# Patient Record
Sex: Male | Born: 1975
Health system: Southern US, Community
[De-identification: ages and names within clinical notes are randomized; demographics above are authoritative.]

## PROBLEM LIST (undated history)

## (undated) DIAGNOSIS — N200 Calculus of kidney: Secondary | ICD-10-CM

## (undated) HISTORY — PX: KNEE SURGERY: SHX244

---

## 2002-01-31 ENCOUNTER — Encounter: Payer: Self-pay | Admitting: Emergency Medicine

## 2002-01-31 ENCOUNTER — Inpatient Hospital Stay (HOSPITAL_COMMUNITY): Admission: EM | Admit: 2002-01-31 | Discharge: 2002-02-03 | Payer: Self-pay | Admitting: Emergency Medicine

## 2004-12-10 ENCOUNTER — Emergency Department (HOSPITAL_COMMUNITY): Admission: EM | Admit: 2004-12-10 | Discharge: 2004-12-10 | Payer: Self-pay | Admitting: Emergency Medicine

## 2006-12-22 ENCOUNTER — Emergency Department (HOSPITAL_COMMUNITY): Admission: EM | Admit: 2006-12-22 | Discharge: 2006-12-22 | Payer: Self-pay | Admitting: Emergency Medicine

## 2007-06-07 ENCOUNTER — Emergency Department (HOSPITAL_COMMUNITY): Admission: EM | Admit: 2007-06-07 | Discharge: 2007-06-07 | Payer: Self-pay | Admitting: Emergency Medicine

## 2008-04-14 ENCOUNTER — Emergency Department (HOSPITAL_COMMUNITY): Admission: EM | Admit: 2008-04-14 | Discharge: 2008-04-14 | Payer: Self-pay | Admitting: Emergency Medicine
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
## 2011-02-08 ENCOUNTER — Emergency Department (HOSPITAL_COMMUNITY): Payer: Self-pay

## 2011-02-08 ENCOUNTER — Emergency Department (HOSPITAL_COMMUNITY)
Admission: EM | Admit: 2011-02-08 | Discharge: 2011-02-08 | Disposition: A | Discharge status: Home / Self Care | Payer: Self-pay | Attending: Emergency Medicine | Admitting: Emergency Medicine

## 2011-02-08 DIAGNOSIS — R339 Retention of urine, unspecified: Secondary | ICD-10-CM | POA: Insufficient documentation

## 2011-02-08 DIAGNOSIS — N201 Calculus of ureter: Secondary | ICD-10-CM | POA: Insufficient documentation

## 2011-02-08 DIAGNOSIS — R109 Unspecified abdominal pain: Secondary | ICD-10-CM | POA: Insufficient documentation

## 2011-02-08 LAB — URINALYSIS, ROUTINE W REFLEX MICROSCOPIC
Glucose, UA: NEGATIVE mg/dL
Ketones, ur: >80 mg/dL — AB
Nitrite: NEGATIVE
Protein, ur: 100 mg/dL — AB
Urobilinogen, UA: 1 mg/dL (ref 0.0–1.0)
pH: 6 (ref 5.0–8.0)

## 2011-02-08 LAB — DIFFERENTIAL
Basophils Absolute: 0 10*3/uL (ref 0.0–0.1)
Eosinophils Absolute: 0 10*3/uL (ref 0.0–0.7)
Eosinophils Relative: 0 % (ref 0–5)
Lymphs Abs: 2 10*3/uL (ref 0.7–4.0)
Monocytes Relative: 11 % (ref 3–12)
Neutro Abs: 15.5 10*3/uL — ABNORMAL HIGH (ref 1.7–7.7)
Neutrophils Relative %: 79 % — ABNORMAL HIGH (ref 43–77)

## 2011-02-08 LAB — BASIC METABOLIC PANEL
BUN: 12 mg/dL (ref 6–23)
CO2: 27 mEq/L (ref 19–32)
Calcium: 10.1 mg/dL (ref 8.4–10.5)
Chloride: 97 mEq/L (ref 96–112)
Creatinine, Ser: 1.46 mg/dL — ABNORMAL HIGH (ref 0.50–1.35)
GFR calc Af Amer: >60 mL/min (ref >60)
GFR calc non Af Amer: 55 mL/min — ABNORMAL LOW (ref >60)
Glucose, Bld: 83 mg/dL (ref 70–99)
Potassium: 4.1 mEq/L (ref 3.5–5.1)
Sodium: 137 mEq/L (ref 135–145)

## 2011-02-08 LAB — CBC
MCH: 31.5 pg (ref 26.0–34.0)
MCHC: 34.2 g/dL (ref 30.0–36.0)
MCV: 92 fL (ref 78.0–100.0)
Platelets: 229 10*3/uL (ref 150–400)
RBC: 4.86 MIL/uL (ref 4.22–5.81)
RDW: 13.8 % (ref 11.5–15.5)

## 2011-02-08 LAB — URINE MICROSCOPIC-ADD ON

## 2011-02-10 LAB — URINE CULTURE: Colony Count: NO GROWTH

## 2011-02-26 ENCOUNTER — Emergency Department (HOSPITAL_COMMUNITY)
Admission: EM | Admit: 2011-02-26 | Discharge: 2011-02-26 | Discharge status: Against Medical Advice | Payer: Self-pay | Attending: Emergency Medicine | Admitting: Emergency Medicine

## 2011-02-26 DIAGNOSIS — R109 Unspecified abdominal pain: Secondary | ICD-10-CM | POA: Insufficient documentation

## 2011-02-27 ENCOUNTER — Inpatient Hospital Stay (INDEPENDENT_AMBULATORY_CARE_PROVIDER_SITE_OTHER)
Admission: RE | Admit: 2011-02-27 | Discharge: 2011-02-27 | Disposition: A | Discharge status: Home / Self Care | Payer: Self-pay | Source: Ambulatory Visit | Attending: Family Medicine | Admitting: Family Medicine

## 2011-02-27 DIAGNOSIS — R319 Hematuria, unspecified: Secondary | ICD-10-CM

## 2011-02-27 LAB — POCT URINALYSIS DIP (DEVICE)
Bilirubin Urine: NEGATIVE
Glucose, UA: NEGATIVE mg/dL
Ketones, ur: NEGATIVE mg/dL
Nitrite: NEGATIVE
pH: 6.5 (ref 5.0–8.0)

## 2011-04-23 LAB — CULTURE, ROUTINE-ABSCESS

## 2011-05-02 LAB — CULTURE, ROUTINE-ABSCESS

## 2012-04-18 ENCOUNTER — Emergency Department (HOSPITAL_COMMUNITY)
Admission: EM | Admit: 2012-04-18 | Discharge: 2012-04-18 | Disposition: A | Discharge status: Home / Self Care | Payer: Self-pay | Attending: Emergency Medicine | Admitting: Emergency Medicine

## 2012-04-18 ENCOUNTER — Encounter (HOSPITAL_COMMUNITY): Payer: Self-pay | Admitting: Emergency Medicine

## 2012-04-18 DIAGNOSIS — Z833 Family history of diabetes mellitus: Secondary | ICD-10-CM | POA: Insufficient documentation

## 2012-04-18 DIAGNOSIS — Z809 Family history of malignant neoplasm, unspecified: Secondary | ICD-10-CM | POA: Insufficient documentation

## 2012-04-18 DIAGNOSIS — F17200 Nicotine dependence, unspecified, uncomplicated: Secondary | ICD-10-CM | POA: Insufficient documentation

## 2012-04-18 DIAGNOSIS — H1130 Conjunctival hemorrhage, unspecified eye: Secondary | ICD-10-CM | POA: Insufficient documentation

## 2012-04-18 DIAGNOSIS — Z8249 Family history of ischemic heart disease and other diseases of the circulatory system: Secondary | ICD-10-CM | POA: Insufficient documentation

## 2012-04-18 DIAGNOSIS — R220 Localized swelling, mass and lump, head: Secondary | ICD-10-CM | POA: Insufficient documentation

## 2012-04-18 HISTORY — DX: Calculus of kidney: N20.0
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
## 2012-04-18 NOTE — ED Provider Notes (Signed)
Medical screening examination/treatment/procedure(s) were performed by non-physician practitioner and as supervising physician I was immediately available for consultation/collaboration.   Lyanne Co, MD 04/18/12 1524

## 2016-10-21 ENCOUNTER — Ambulatory Visit (HOSPITAL_COMMUNITY)
Admission: EM | Admit: 2016-10-21 | Discharge: 2016-10-21 | Disposition: A | Discharge status: Home / Self Care | Payer: BLUE CROSS/BLUE SHIELD | Attending: Internal Medicine | Admitting: Internal Medicine

## 2016-10-21 ENCOUNTER — Encounter (HOSPITAL_COMMUNITY): Payer: Self-pay | Admitting: Emergency Medicine
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
## 2016-10-21 NOTE — ED Notes (Signed)
Patient gave a urine specimen, not seen by provider, did not wait to be seen, provider canceled order

## 2016-10-21 NOTE — ED Notes (Signed)
Unable to wait, left

## 2016-10-21 NOTE — ED Triage Notes (Signed)
The patient presented to the [facility] for an STD check. The patient denied any symptoms.

## 2020-06-15 ENCOUNTER — Encounter (HOSPITAL_COMMUNITY)
Admission: EM | Disposition: A | Discharge status: Home Health | Payer: Self-pay | Source: Home / Self Care | Attending: Student

## 2020-06-15 ENCOUNTER — Emergency Department (HOSPITAL_COMMUNITY): Payer: 59

## 2020-06-15 ENCOUNTER — Inpatient Hospital Stay (HOSPITAL_COMMUNITY): Payer: 59

## 2020-06-15 ENCOUNTER — Emergency Department (HOSPITAL_COMMUNITY): Payer: 59 | Admitting: Anesthesiology

## 2020-06-15 ENCOUNTER — Encounter (HOSPITAL_COMMUNITY): Payer: Self-pay

## 2020-06-15 ENCOUNTER — Inpatient Hospital Stay (HOSPITAL_COMMUNITY)
Admission: EM | Admit: 2020-06-15 | Discharge: 2020-06-19 | DRG: 480 | Disposition: A | Discharge status: Home Health | Payer: 59 | Attending: Student | Admitting: Student

## 2020-06-15 DIAGNOSIS — S82202B Unspecified fracture of shaft of left tibia, initial encounter for open fracture type I or II: Principal | ICD-10-CM

## 2020-06-15 DIAGNOSIS — Z419 Encounter for procedure for purposes other than remedying health state, unspecified: Secondary | ICD-10-CM

## 2020-06-15 DIAGNOSIS — T1490XA Injury, unspecified, initial encounter: Secondary | ICD-10-CM

## 2020-06-15 DIAGNOSIS — S82252B Displaced comminuted fracture of shaft of left tibia, initial encounter for open fracture type I or II: Secondary | ICD-10-CM | POA: Diagnosis present

## 2020-06-15 DIAGNOSIS — Z23 Encounter for immunization: Secondary | ICD-10-CM

## 2020-06-15 DIAGNOSIS — S82402B Unspecified fracture of shaft of left fibula, initial encounter for open fracture type I or II: Secondary | ICD-10-CM

## 2020-06-15 DIAGNOSIS — S8982XA Other specified injuries of left lower leg, initial encounter: Secondary | ICD-10-CM | POA: Diagnosis not present

## 2020-06-15 DIAGNOSIS — S82832B Other fracture of upper and lower end of left fibula, initial encounter for open fracture type I or II: Secondary | ICD-10-CM | POA: Diagnosis not present

## 2020-06-15 DIAGNOSIS — S0993XA Unspecified injury of face, initial encounter: Secondary | ICD-10-CM | POA: Diagnosis not present

## 2020-06-15 DIAGNOSIS — F17210 Nicotine dependence, cigarettes, uncomplicated: Secondary | ICD-10-CM | POA: Diagnosis not present

## 2020-06-15 DIAGNOSIS — S7222XA Displaced subtrochanteric fracture of left femur, initial encounter for closed fracture: Secondary | ICD-10-CM

## 2020-06-15 DIAGNOSIS — D62 Acute posthemorrhagic anemia: Secondary | ICD-10-CM | POA: Diagnosis not present

## 2020-06-15 DIAGNOSIS — S8292XB Unspecified fracture of left lower leg, initial encounter for open fracture type I or II: Secondary | ICD-10-CM

## 2020-06-15 DIAGNOSIS — Y92410 Unspecified street and highway as the place of occurrence of the external cause: Secondary | ICD-10-CM | POA: Diagnosis not present

## 2020-06-15 DIAGNOSIS — I959 Hypotension, unspecified: Secondary | ICD-10-CM | POA: Diagnosis present

## 2020-06-15 DIAGNOSIS — S72142A Displaced intertrochanteric fracture of left femur, initial encounter for closed fracture: Secondary | ICD-10-CM | POA: Diagnosis not present

## 2020-06-15 DIAGNOSIS — S81811A Laceration without foreign body, right lower leg, initial encounter: Secondary | ICD-10-CM | POA: Diagnosis present

## 2020-06-15 DIAGNOSIS — Z20822 Contact with and (suspected) exposure to covid-19: Secondary | ICD-10-CM | POA: Diagnosis not present

## 2020-06-15 DIAGNOSIS — T148XXA Other injury of unspecified body region, initial encounter: Secondary | ICD-10-CM

## 2020-06-15 DIAGNOSIS — S72002A Fracture of unspecified part of neck of left femur, initial encounter for closed fracture: Secondary | ICD-10-CM

## 2020-06-15 DIAGNOSIS — S82392B Other fracture of lower end of left tibia, initial encounter for open fracture type I or II: Secondary | ICD-10-CM | POA: Diagnosis not present

## 2020-06-15 HISTORY — PX: I & D EXTREMITY: SHX5045

## 2020-06-15 HISTORY — PX: EXTERNAL FIXATION LEG: SHX1549

## 2020-06-15 LAB — URINALYSIS, ROUTINE W REFLEX MICROSCOPIC
Bilirubin Urine: NEGATIVE
Glucose, UA: NEGATIVE mg/dL
Hgb urine dipstick: NEGATIVE
Ketones, ur: NEGATIVE mg/dL
Leukocytes,Ua: NEGATIVE
Nitrite: NEGATIVE
Protein, ur: NEGATIVE mg/dL
Specific Gravity, Urine: 1.011 (ref 1.005–1.030)
pH: 6 (ref 5.0–8.0)

## 2020-06-15 LAB — COMPREHENSIVE METABOLIC PANEL
ALT: 38 U/L (ref 0–44)
AST: 41 U/L (ref 15–41)
Albumin: 3 g/dL — ABNORMAL LOW (ref 3.5–5.0)
Alkaline Phosphatase: 35 U/L — ABNORMAL LOW (ref 38–126)
Anion gap: 12 (ref 5–15)
BUN: 7 mg/dL (ref 6–20)
CO2: 18 mmol/L — ABNORMAL LOW (ref 22–32)
Calcium: 8.1 mg/dL — ABNORMAL LOW (ref 8.9–10.3)
Chloride: 107 mmol/L (ref 98–111)
Creatinine, Ser: 1.15 mg/dL (ref 0.61–1.24)
GFR, Estimated: >60 mL/min (ref >60)
Glucose, Bld: 118 mg/dL — ABNORMAL HIGH (ref 70–99)
Potassium: 3.6 mmol/L (ref 3.5–5.1)
Sodium: 137 mmol/L (ref 135–145)
Total Bilirubin: 0.5 mg/dL (ref 0.3–1.2)
Total Protein: 5.4 g/dL — ABNORMAL LOW (ref 6.5–8.1)

## 2020-06-15 LAB — I-STAT CHEM 8, ED
BUN: 9 mg/dL (ref 6–20)
Calcium, Ion: 1.05 mmol/L — ABNORMAL LOW (ref 1.15–1.40)
Chloride: 106 mmol/L (ref 98–111)
Creatinine, Ser: 1.1 mg/dL (ref 0.61–1.24)
Glucose, Bld: 119 mg/dL — ABNORMAL HIGH (ref 70–99)
HCT: 38 % — ABNORMAL LOW (ref 39.0–52.0)
Hemoglobin: 12.9 g/dL — ABNORMAL LOW (ref 13.0–17.0)
Potassium: 3.6 mmol/L (ref 3.5–5.1)
Sodium: 139 mmol/L (ref 135–145)
TCO2: 20 mmol/L — ABNORMAL LOW (ref 22–32)

## 2020-06-15 LAB — RESP PANEL BY RT-PCR (FLU A&B, COVID) ARPGX2
Influenza A by PCR: NEGATIVE
Influenza B by PCR: NEGATIVE
SARS Coronavirus 2 by RT PCR: NEGATIVE

## 2020-06-15 LAB — CBC
HCT: 40.5 % (ref 39.0–52.0)
Hemoglobin: 13.4 g/dL (ref 13.0–17.0)
MCH: 31.9 pg (ref 26.0–34.0)
MCHC: 33.1 g/dL (ref 30.0–36.0)
MCV: 96.4 fL (ref 80.0–100.0)
Platelets: 210 10*3/uL (ref 150–400)
RBC: 4.2 MIL/uL — ABNORMAL LOW (ref 4.22–5.81)
RDW: 13.8 % (ref 11.5–15.5)
WBC: 12 10*3/uL — ABNORMAL HIGH (ref 4.0–10.5)
nRBC: 0 % (ref 0.0–0.2)

## 2020-06-15 LAB — PROTIME-INR
INR: 1.1 (ref 0.8–1.2)
Prothrombin Time: 13.7 seconds (ref 11.4–15.2)

## 2020-06-15 LAB — ETHANOL: Alcohol, Ethyl (B): 89 mg/dL — ABNORMAL HIGH (ref <10)

## 2020-06-15 LAB — ABO/RH: ABO/RH(D): A POS

## 2020-06-15 LAB — LACTIC ACID, PLASMA: Lactic Acid, Venous: 2.6 mmol/L (ref 0.5–1.9)

## 2020-06-15 SURGERY — EXTERNAL FIXATION, LOWER EXTREMITY
Anesthesia: General | Site: Leg Lower | Laterality: Right

## 2020-06-15 MED ORDER — SODIUM CHLORIDE 0.9 % IV SOLN: INTRAVENOUS | Status: DC

## 2020-06-15 MED ORDER — FENTANYL CITRATE (PF) 250 MCG/5ML IJ SOLN
INTRAMUSCULAR | Status: AC
  Filled 2020-06-15: qty 5

## 2020-06-15 MED ORDER — ACETAMINOPHEN 500 MG PO TABS
1000.0000 mg | ORAL_TABLET | Freq: Four times a day (QID) | ORAL | Status: AC
  Administered 2020-06-15 – 2020-06-16 (×2): 1000 mg via ORAL
  Filled 2020-06-15 (×2): qty 2

## 2020-06-15 MED ORDER — 0.9 % SODIUM CHLORIDE (POUR BTL) OPTIME
TOPICAL | Status: DC | PRN
  Administered 2020-06-15: 1000 mL

## 2020-06-15 MED ORDER — CEFAZOLIN SODIUM-DEXTROSE 2-4 GM/100ML-% IV SOLN
2.0000 g | Freq: Four times a day (QID) | INTRAVENOUS | Status: DC
  Administered 2020-06-15 – 2020-06-16 (×2): 2 g via INTRAVENOUS
  Filled 2020-06-15 (×2): qty 100

## 2020-06-15 MED ORDER — IOHEXOL 300 MG/ML  SOLN
100.0000 mL | Freq: Once | INTRAMUSCULAR | Status: AC | PRN
  Administered 2020-06-15: 100 mL via INTRAVENOUS

## 2020-06-15 MED ORDER — SUCCINYLCHOLINE 20MG/ML (10ML) SYRINGE FOR MEDFUSION PUMP - OPTIME
INTRAMUSCULAR | Status: DC | PRN
  Administered 2020-06-15: 120 mg via INTRAVENOUS

## 2020-06-15 MED ORDER — ACETAMINOPHEN 325 MG PO TABS: 325.0000 mg | ORAL_TABLET | Freq: Four times a day (QID) | ORAL | Status: DC | PRN

## 2020-06-15 MED ORDER — LACTATED RINGERS IV SOLN: INTRAVENOUS | Status: DC | PRN

## 2020-06-15 MED ORDER — ONDANSETRON HCL 4 MG/2ML IJ SOLN
INTRAMUSCULAR | Status: DC | PRN
  Administered 2020-06-15: 4 mg via INTRAVENOUS

## 2020-06-15 MED ORDER — CEFAZOLIN SODIUM-DEXTROSE 2-4 GM/100ML-% IV SOLN
2.0000 g | Freq: Once | INTRAVENOUS | Status: AC
  Administered 2020-06-15: 2 g via INTRAVENOUS

## 2020-06-15 MED ORDER — TETANUS-DIPHTH-ACELL PERTUSSIS 5-2.5-18.5 LF-MCG/0.5 IM SUSY
0.5000 mL | PREFILLED_SYRINGE | Freq: Once | INTRAMUSCULAR | Status: AC
  Administered 2020-06-15: 0.5 mL via INTRAMUSCULAR

## 2020-06-15 MED ORDER — SODIUM CHLORIDE 0.9 % IR SOLN
Status: DC | PRN
  Administered 2020-06-15 (×2): 3000 mL

## 2020-06-15 MED ORDER — POLYETHYLENE GLYCOL 3350 17 G PO PACK: 17.0000 g | PACK | Freq: Every day | ORAL | Status: DC | PRN

## 2020-06-15 MED ORDER — SODIUM CHLORIDE 0.9 % IV SOLN
INTRAVENOUS | Status: AC | PRN
  Administered 2020-06-15 (×2): 1000 mL via INTRAVENOUS

## 2020-06-15 MED ORDER — MIDAZOLAM HCL 2 MG/2ML IJ SOLN
INTRAMUSCULAR | Status: DC | PRN
  Administered 2020-06-15: 2 mg via INTRAVENOUS

## 2020-06-15 MED ORDER — METOCLOPRAMIDE HCL 5 MG PO TABS: 5.0000 mg | ORAL_TABLET | Freq: Three times a day (TID) | ORAL | Status: DC | PRN

## 2020-06-15 MED ORDER — HYDROMORPHONE HCL 1 MG/ML IJ SOLN: 0.5000 mg | INTRAMUSCULAR | Status: DC | PRN

## 2020-06-15 MED ORDER — OXYCODONE HCL 5 MG PO TABS
5.0000 mg | ORAL_TABLET | ORAL | Status: DC | PRN
  Administered 2020-06-16: 5 mg via ORAL
  Administered 2020-06-17 – 2020-06-18 (×4): 10 mg via ORAL
  Filled 2020-06-15 (×6): qty 2
  Filled 2020-06-15: qty 1
  Filled 2020-06-15 (×5): qty 2

## 2020-06-15 MED ORDER — ONDANSETRON HCL 4 MG/2ML IJ SOLN
INTRAMUSCULAR | Status: AC
  Filled 2020-06-15: qty 2

## 2020-06-15 MED ORDER — MIDAZOLAM HCL 2 MG/2ML IJ SOLN
INTRAMUSCULAR | Status: AC
  Filled 2020-06-15: qty 2

## 2020-06-15 MED ORDER — LIDOCAINE HCL (CARDIAC) PF 100 MG/5ML IV SOSY
PREFILLED_SYRINGE | INTRAVENOUS | Status: DC | PRN
  Administered 2020-06-15: 60 mg via INTRAVENOUS

## 2020-06-15 MED ORDER — PHENYLEPHRINE HCL (PRESSORS) 10 MG/ML IV SOLN
INTRAVENOUS | Status: AC
  Filled 2020-06-15: qty 1

## 2020-06-15 MED ORDER — OXYCODONE HCL 5 MG/5ML PO SOLN: 5.0000 mg | Freq: Once | ORAL | Status: DC | PRN

## 2020-06-15 MED ORDER — HYDROMORPHONE HCL 1 MG/ML IJ SOLN
1.0000 mg | INTRAMUSCULAR | Status: DC | PRN
  Administered 2020-06-15: 1 mg via INTRAVENOUS
  Filled 2020-06-15: qty 1

## 2020-06-15 MED ORDER — LIDOCAINE HCL (PF) 2 % IJ SOLN
INTRAMUSCULAR | Status: AC
  Filled 2020-06-15: qty 5

## 2020-06-15 MED ORDER — ONDANSETRON HCL 4 MG/2ML IJ SOLN: 4.0000 mg | Freq: Four times a day (QID) | INTRAMUSCULAR | Status: DC | PRN

## 2020-06-15 MED ORDER — DIPHENHYDRAMINE HCL 12.5 MG/5ML PO ELIX: 25.0000 mg | ORAL_SOLUTION | ORAL | Status: DC | PRN

## 2020-06-15 MED ORDER — ONDANSETRON HCL 4 MG PO TABS: 4.0000 mg | ORAL_TABLET | Freq: Four times a day (QID) | ORAL | Status: DC | PRN

## 2020-06-15 MED ORDER — FENTANYL CITRATE (PF) 100 MCG/2ML IJ SOLN
25.0000 ug | INTRAMUSCULAR | Status: DC | PRN
  Administered 2020-06-15 (×2): 50 ug via INTRAVENOUS

## 2020-06-15 MED ORDER — CEFAZOLIN SODIUM-DEXTROSE 1-4 GM/50ML-% IV SOLN
INTRAVENOUS | Status: DC | PRN
  Administered 2020-06-15: 1 g via INTRAVENOUS

## 2020-06-15 MED ORDER — CEFAZOLIN SODIUM 1 G IJ SOLR
INTRAMUSCULAR | Status: AC
  Filled 2020-06-15: qty 10

## 2020-06-15 MED ORDER — OXYCODONE HCL 5 MG PO TABS
10.0000 mg | ORAL_TABLET | ORAL | Status: DC | PRN
  Administered 2020-06-16 – 2020-06-19 (×8): 10 mg via ORAL
  Filled 2020-06-15: qty 2

## 2020-06-15 MED ORDER — PROPOFOL 10 MG/ML IV BOLUS
INTRAVENOUS | Status: AC
  Filled 2020-06-15: qty 20

## 2020-06-15 MED ORDER — METHOCARBAMOL 500 MG PO TABS
500.0000 mg | ORAL_TABLET | Freq: Four times a day (QID) | ORAL | Status: DC | PRN
  Administered 2020-06-17 – 2020-06-19 (×7): 500 mg via ORAL
  Filled 2020-06-15 (×9): qty 1

## 2020-06-15 MED ORDER — DEXAMETHASONE SODIUM PHOSPHATE 10 MG/ML IJ SOLN
INTRAMUSCULAR | Status: AC
  Filled 2020-06-15: qty 1

## 2020-06-15 MED ORDER — FENTANYL CITRATE (PF) 250 MCG/5ML IJ SOLN
INTRAMUSCULAR | Status: DC | PRN
  Administered 2020-06-15: 100 ug via INTRAVENOUS

## 2020-06-15 MED ORDER — MAGNESIUM CITRATE PO SOLN: 1.0000 | Freq: Once | ORAL | Status: DC | PRN

## 2020-06-15 MED ORDER — SORBITOL 70 % SOLN
30.0000 mL | Freq: Every day | Status: DC | PRN
  Filled 2020-06-15: qty 30

## 2020-06-15 MED ORDER — SUCCINYLCHOLINE CHLORIDE 200 MG/10ML IV SOSY
PREFILLED_SYRINGE | INTRAVENOUS | Status: AC
  Filled 2020-06-15: qty 10

## 2020-06-15 MED ORDER — ROCURONIUM BROMIDE 10 MG/ML (PF) SYRINGE
PREFILLED_SYRINGE | INTRAVENOUS | Status: AC
  Filled 2020-06-15: qty 10

## 2020-06-15 MED ORDER — FENTANYL CITRATE (PF) 100 MCG/2ML IJ SOLN
INTRAMUSCULAR | Status: AC
  Filled 2020-06-15: qty 2

## 2020-06-15 MED ORDER — OXYCODONE HCL 5 MG PO TABS: 5.0000 mg | ORAL_TABLET | Freq: Once | ORAL | Status: DC | PRN

## 2020-06-15 MED ORDER — PROPOFOL 10 MG/ML IV BOLUS
INTRAVENOUS | Status: DC | PRN
  Administered 2020-06-15: 170 mg via INTRAVENOUS

## 2020-06-15 MED ORDER — DOCUSATE SODIUM 100 MG PO CAPS
100.0000 mg | ORAL_CAPSULE | Freq: Two times a day (BID) | ORAL | Status: DC
  Administered 2020-06-16 – 2020-06-19 (×6): 100 mg via ORAL
  Filled 2020-06-15 (×6): qty 1

## 2020-06-15 MED ORDER — PHENYLEPHRINE HCL (PRESSORS) 10 MG/ML IV SOLN
INTRAVENOUS | Status: DC | PRN
  Administered 2020-06-15: 120 ug via INTRAVENOUS
  Administered 2020-06-15 (×2): 80 ug via INTRAVENOUS
  Administered 2020-06-15: 120 ug via INTRAVENOUS

## 2020-06-15 MED ORDER — PHENYLEPHRINE HCL-NACL 10-0.9 MG/250ML-% IV SOLN
INTRAVENOUS | Status: DC | PRN
  Administered 2020-06-15: 50 ug/min via INTRAVENOUS

## 2020-06-15 MED ORDER — METHOCARBAMOL 1000 MG/10ML IJ SOLN
500.0000 mg | Freq: Four times a day (QID) | INTRAVENOUS | Status: DC | PRN
  Filled 2020-06-15 (×3): qty 5

## 2020-06-15 MED ORDER — LACTATED RINGERS IV SOLN: INTRAVENOUS | Status: DC

## 2020-06-15 MED ORDER — METOCLOPRAMIDE HCL 5 MG/ML IJ SOLN: 5.0000 mg | Freq: Three times a day (TID) | INTRAMUSCULAR | Status: DC | PRN

## 2020-06-15 MED ORDER — DEXAMETHASONE SODIUM PHOSPHATE 10 MG/ML IJ SOLN
INTRAMUSCULAR | Status: DC | PRN
  Administered 2020-06-15: 10 mg via INTRAVENOUS

## 2020-06-15 SURGICAL SUPPLY — 51 items
BAR EXFX 150X11 NS LF (EXFIX) ×4
BAR EXFX 400X11 NS LF (EXFIX) ×4
BAR GLASS FIBER EXFX 11X150 (EXFIX) ×4 IMPLANT
BAR GLASS FIBER EXFX 11X400 (EXFIX) ×4 IMPLANT
BIT DRILL CANN MED FLUTE 4.0 (BIT) IMPLANT
BNDG ELASTIC 4X5.8 VLCR STR LF (GAUZE/BANDAGES/DRESSINGS) ×14 IMPLANT
BNDG GAUZE ELAST 4 BULKY (GAUZE/BANDAGES/DRESSINGS) ×12 IMPLANT
CLAMP BLUE BAR TO BAR (EXFIX) ×4 IMPLANT
CLAMP BLUE BAR TO PIN (EXFIX) ×12 IMPLANT
COVER SURGICAL LIGHT HANDLE (MISCELLANEOUS) ×4 IMPLANT
COVER WAND RF STERILE (DRAPES) ×4 IMPLANT
DRAPE BILATERAL LIMB T (DRAPES) ×2 IMPLANT
DRAPE C-ARM 42X72 X-RAY (DRAPES) ×2 IMPLANT
DRAPE C-ARMOR (DRAPES) ×4 IMPLANT
DRAPE IMP U-DRAPE 54X76 (DRAPES) ×2 IMPLANT
DRAPE U-SHAPE 47X51 STRL (DRAPES) ×4 IMPLANT
DRILL CANN 4.0MM (BIT) ×4
ELECT REM PT RETURN 9FT ADLT (ELECTROSURGICAL) ×4
ELECTRODE REM PT RTRN 9FT ADLT (ELECTROSURGICAL) ×2 IMPLANT
GAUZE SPONGE 4X4 12PLY STRL (GAUZE/BANDAGES/DRESSINGS) ×8 IMPLANT
GAUZE XEROFORM 5X9 LF (GAUZE/BANDAGES/DRESSINGS) ×8 IMPLANT
GLOVE BIOGEL PI IND STRL 7.0 (GLOVE) ×2 IMPLANT
GLOVE BIOGEL PI INDICATOR 7.0 (GLOVE) ×2
GLOVE ECLIPSE 7.0 STRL STRAW (GLOVE) ×4 IMPLANT
GLOVE SKINSENSE NS SZ7.5 (GLOVE) ×4
GLOVE SKINSENSE STRL SZ7.5 (GLOVE) ×4 IMPLANT
GLOVE SURG SYN 7.5  E (GLOVE) ×4
GLOVE SURG SYN 7.5 E (GLOVE) ×2 IMPLANT
GLOVE SURG SYN 7.5 PF PI (GLOVE) IMPLANT
GOWN STRL REIN XL XLG (GOWN DISPOSABLE) ×4 IMPLANT
HANDPIECE INTERPULSE COAX TIP (DISPOSABLE) ×4
KIT BASIN OR (CUSTOM PROCEDURE TRAY) ×4 IMPLANT
KIT TURNOVER KIT B (KITS) ×4 IMPLANT
NS IRRIG 1000ML POUR BTL (IV SOLUTION) ×4 IMPLANT
PACK ORTHO EXTREMITY (CUSTOM PROCEDURE TRAY) ×4 IMPLANT
PAD ARMBOARD 7.5X6 YLW CONV (MISCELLANEOUS) ×8 IMPLANT
PIN HALF YELLOW 5X160X35 (EXFIX) ×4 IMPLANT
PIN TRANSFIXING 5.0 (EXFIX) ×2 IMPLANT
SET HNDPC FAN SPRY TIP SCT (DISPOSABLE) IMPLANT
STOCKINETTE 6  STRL (DRAPES) ×4
STOCKINETTE 6 STRL (DRAPES) IMPLANT
STOCKINETTE IMPERVIOUS LG (DRAPES) ×4 IMPLANT
SUT ETHILON 2 0 FS 18 (SUTURE) ×8 IMPLANT
TOWEL GREEN STERILE (TOWEL DISPOSABLE) ×8 IMPLANT
TOWEL GREEN STERILE FF (TOWEL DISPOSABLE) ×8 IMPLANT
TRAY FOLEY W/BAG SLVR 14FR LF (SET/KITS/TRAYS/PACK) ×2 IMPLANT
TUBE CONNECTING 12'X1/4 (SUCTIONS) ×1
TUBE CONNECTING 12X1/4 (SUCTIONS) ×3 IMPLANT
UNDERPAD 30X36 HEAVY ABSORB (UNDERPADS AND DIAPERS) ×6 IMPLANT
WATER STERILE IRR 1000ML POUR (IV SOLUTION) ×8 IMPLANT
YANKAUER SUCT BULB TIP NO VENT (SUCTIONS) ×4 IMPLANT

## 2020-06-15 NOTE — Anesthesia Procedure Notes (Signed)
Performed by: Jaekwon Mcclune M, CRNA       
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
## 2020-06-15 NOTE — Transfer of Care (Signed)
Immediate Anesthesia Transfer of Care Note  Patient: Kyle Spencer  Procedure(s) Performed: IRRIGATION AND DEBRIDEMENT and  EXTERNAL FIXATION OF LEFT ANKLE (Left Ankle) IRRIGATION AND DEBRIDEMENT RIGHT LOWER LEG (Right Leg Lower)  Patient Location: PACU  Anesthesia Type:General  Level of Consciousness: sedated  Airway & Oxygen Therapy: Patient connected to face mask oxygen  Post-op Assessment: Report given to RN and Post -op Vital signs reviewed and stable  Post vital signs: Reviewed and stable  Last Vitals:  Vitals Value Taken Time  BP 144/125 06/15/20 2122  Temp    Pulse 124 06/15/20 2124  Resp 15 06/15/20 2124  SpO2 91 % 06/15/20 2124  Vitals shown include unvalidated device data.  Last Pain:  Vitals:   06/15/20 1800  TempSrc:   PainSc: 10-Worst pain ever         Complications: No complications documented.
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
## 2020-06-15 NOTE — ED Notes (Signed)
Dr. Roda Shutters paged to [phone] Dr. Rubin Payor paged by Marylene Land
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
## 2020-06-15 NOTE — Progress Notes (Signed)
   06/15/20 1725  Clinical Encounter Type  Visited With Patient not available  Visit Type Trauma  Referral From Nurse  Consult/Referral To Chaplain   Chaplain called the ED Bridge twice to attempt to check in while at a [employer]. When Chaplain arrived in the ED, the Pt was not in the room. Chaplain remains available as needed.  This note was prepared by Chaplain Resident, Tacy Learn, MDiv. Chaplain remains available as needed through the on-call pager: [phone].

## 2020-06-15 NOTE — Anesthesia Preprocedure Evaluation (Signed)
Anesthesia Evaluation  Patient identified by MRN, date of birth, ID band Patient awake    Reviewed: Allergy & Precautions, H&P , NPO status , Patient's Chart, lab work & pertinent test results  Airway Mallampati: II   Neck ROM: full    Dental   Pulmonary neg pulmonary ROS,    breath sounds clear to auscultation       Cardiovascular negative cardio ROS   Rhythm:regular Rate:Normal     Neuro/Psych    GI/Hepatic   Endo/Other    Renal/GU      Musculoskeletal Ankle fx   Abdominal   Peds  Hematology   Anesthesia Other Findings   Reproductive/Obstetrics                             Anesthesia Physical Anesthesia Plan  ASA: I  Anesthesia Plan: General   Post-op Pain Management:    Induction: Intravenous  PONV Risk Score and Plan: 2 and Ondansetron, Dexamethasone and Midazolam  Airway Management Planned: Oral ETT  Additional Equipment:   Intra-op Plan:   Post-operative Plan: Extubation in OR  Informed Consent: I have reviewed the patients History and Physical, chart, labs and discussed the procedure including the risks, benefits and alternatives for the proposed anesthesia with the patient or authorized representative who has indicated his/her understanding and acceptance.       Plan Discussed with: CRNA, Anesthesiologist and Surgeon  Anesthesia Plan Comments:         Anesthesia Quick Evaluation

## 2020-06-15 NOTE — Anesthesia Procedure Notes (Addendum)
Date/Time: 06/15/2020 7:46 PM Performed by: Molli Hazard, CRNA Pre-anesthesia Checklist: Patient identified, Emergency Drugs available, Suction available and Patient being monitored Patient Re-evaluated:Patient Re-evaluated prior to induction Oxygen Delivery Method: Circle system utilized Preoxygenation: Pre-oxygenation with 100% oxygen Induction Type: IV induction, Rapid sequence and Cricoid Pressure applied Laryngoscope Size: Miller and 2 Grade View: Grade II Tube type: Oral Number of attempts: 1 Airway Equipment and Method: Stylet Placement Confirmation: ETT inserted through vocal cords under direct vision,  positive ETCO2 and breath sounds checked- equal and bilateral Secured at: 24 cm Tube secured with: Tape Dental Injury: Teeth and Oropharynx as per pre-operative assessment

## 2020-06-16 ENCOUNTER — Inpatient Hospital Stay (HOSPITAL_COMMUNITY): Payer: 59 | Admitting: Certified Registered Nurse Anesthetist

## 2020-06-16 ENCOUNTER — Inpatient Hospital Stay (HOSPITAL_COMMUNITY): Payer: 59

## 2020-06-16 ENCOUNTER — Other Ambulatory Visit: Payer: Self-pay

## 2020-06-16 ENCOUNTER — Encounter (HOSPITAL_COMMUNITY): Payer: Self-pay | Admitting: Orthopaedic Surgery

## 2020-06-16 ENCOUNTER — Encounter (HOSPITAL_COMMUNITY)
Admission: EM | Disposition: A | Discharge status: Home Health | Payer: Self-pay | Source: Home / Self Care | Attending: Student

## 2020-06-16 DIAGNOSIS — S72002A Fracture of unspecified part of neck of left femur, initial encounter for closed fracture: Secondary | ICD-10-CM

## 2020-06-16 DIAGNOSIS — S7222XA Displaced subtrochanteric fracture of left femur, initial encounter for closed fracture: Secondary | ICD-10-CM

## 2020-06-16 HISTORY — PX: INTRAMEDULLARY (IM) NAIL INTERTROCHANTERIC: SHX5875

## 2020-06-16 HISTORY — PX: TIBIA IM NAIL INSERTION: SHX2516

## 2020-06-16 LAB — TYPE AND SCREEN
ABO/RH(D): A POS
Antibody Screen: NEGATIVE
Unit division: 0
Unit division: 0

## 2020-06-16 LAB — BPAM RBC
Blood Product Expiration Date: 202112142359
Blood Product Expiration Date: 202112152359
ISSUE DATE / TIME: 202112021744
ISSUE DATE / TIME: 202112021744
Unit Type and Rh: 5100
Unit Type and Rh: 5100

## 2020-06-16 LAB — BLOOD PRODUCT ORDER (VERBAL) VERIFICATION

## 2020-06-16 LAB — CREATININE, SERUM
Creatinine, Ser: 0.91 mg/dL (ref 0.61–1.24)
GFR, Estimated: >60 mL/min (ref >60)

## 2020-06-16 LAB — CBC
HCT: 30.2 % — ABNORMAL LOW (ref 39.0–52.0)
Hemoglobin: 10.8 g/dL — ABNORMAL LOW (ref 13.0–17.0)
MCH: 32.8 pg (ref 26.0–34.0)
MCHC: 35.8 g/dL (ref 30.0–36.0)
MCV: 91.8 fL (ref 80.0–100.0)
Platelets: 186 10*3/uL (ref 150–400)
RBC: 3.29 MIL/uL — ABNORMAL LOW (ref 4.22–5.81)
RDW: 14 % (ref 11.5–15.5)
WBC: 19.4 10*3/uL — ABNORMAL HIGH (ref 4.0–10.5)
nRBC: 0 % (ref 0.0–0.2)

## 2020-06-16 SURGERY — FIXATION, FRACTURE, INTERTROCHANTERIC, WITH INTRAMEDULLARY ROD
Anesthesia: General | Laterality: Left

## 2020-06-16 MED ORDER — CEFAZOLIN SODIUM-DEXTROSE 1-4 GM/50ML-% IV SOLN
INTRAVENOUS | Status: DC | PRN
  Administered 2020-06-16: 1 g via INTRAVENOUS

## 2020-06-16 MED ORDER — PROMETHAZINE HCL 25 MG/ML IJ SOLN: 6.2500 mg | INTRAMUSCULAR | Status: DC | PRN

## 2020-06-16 MED ORDER — PROPOFOL 10 MG/ML IV BOLUS
INTRAVENOUS | Status: AC
  Filled 2020-06-16: qty 20

## 2020-06-16 MED ORDER — PHENYLEPHRINE HCL-NACL 10-0.9 MG/250ML-% IV SOLN
INTRAVENOUS | Status: DC | PRN
  Administered 2020-06-16: 40 ug/min via INTRAVENOUS

## 2020-06-16 MED ORDER — DEXAMETHASONE SODIUM PHOSPHATE 10 MG/ML IJ SOLN
INTRAMUSCULAR | Status: AC
  Filled 2020-06-16: qty 1

## 2020-06-16 MED ORDER — ROCURONIUM BROMIDE 10 MG/ML (PF) SYRINGE
PREFILLED_SYRINGE | INTRAVENOUS | Status: DC | PRN
  Administered 2020-06-16: 50 mg via INTRAVENOUS
  Administered 2020-06-16: 30 mg via INTRAVENOUS
  Administered 2020-06-16: 20 mg via INTRAVENOUS
  Administered 2020-06-16: 50 mg via INTRAVENOUS

## 2020-06-16 MED ORDER — ONDANSETRON HCL 4 MG/2ML IJ SOLN
INTRAMUSCULAR | Status: DC | PRN
  Administered 2020-06-16: 4 mg via INTRAVENOUS

## 2020-06-16 MED ORDER — PROPOFOL 10 MG/ML IV BOLUS
INTRAVENOUS | Status: DC | PRN
  Administered 2020-06-16: 200 mg via INTRAVENOUS

## 2020-06-16 MED ORDER — LIDOCAINE HCL (PF) 2 % IJ SOLN
INTRAMUSCULAR | Status: AC
  Filled 2020-06-16: qty 5

## 2020-06-16 MED ORDER — LACTATED RINGERS IV SOLN: INTRAVENOUS | Status: DC

## 2020-06-16 MED ORDER — PHENYLEPHRINE 40 MCG/ML (10ML) SYRINGE FOR IV PUSH (FOR BLOOD PRESSURE SUPPORT)
PREFILLED_SYRINGE | INTRAVENOUS | Status: DC | PRN
  Administered 2020-06-16 (×2): 200 ug via INTRAVENOUS

## 2020-06-16 MED ORDER — VANCOMYCIN HCL 1000 MG IV SOLR
INTRAVENOUS | Status: AC
  Filled 2020-06-16: qty 1000

## 2020-06-16 MED ORDER — ROCURONIUM BROMIDE 10 MG/ML (PF) SYRINGE
PREFILLED_SYRINGE | INTRAVENOUS | Status: AC
  Filled 2020-06-16: qty 10

## 2020-06-16 MED ORDER — MIDAZOLAM HCL 2 MG/2ML IJ SOLN
INTRAMUSCULAR | Status: AC
  Filled 2020-06-16: qty 2

## 2020-06-16 MED ORDER — VANCOMYCIN HCL 1000 MG IV SOLR
INTRAVENOUS | Status: DC | PRN
  Administered 2020-06-16: 1000 mg via TOPICAL

## 2020-06-16 MED ORDER — FENTANYL CITRATE (PF) 100 MCG/2ML IJ SOLN
INTRAMUSCULAR | Status: AC
  Filled 2020-06-16: qty 2

## 2020-06-16 MED ORDER — DEXAMETHASONE SODIUM PHOSPHATE 10 MG/ML IJ SOLN
INTRAMUSCULAR | Status: DC | PRN
  Administered 2020-06-16: 10 mg via INTRAVENOUS

## 2020-06-16 MED ORDER — FENTANYL CITRATE (PF) 250 MCG/5ML IJ SOLN
INTRAMUSCULAR | Status: DC | PRN
  Administered 2020-06-16 (×3): 50 ug via INTRAVENOUS
  Administered 2020-06-16: 100 ug via INTRAVENOUS

## 2020-06-16 MED ORDER — CHLORHEXIDINE GLUCONATE 0.12 % MT SOLN
OROMUCOSAL | Status: AC
  Administered 2020-06-16: 15 mL via OROMUCOSAL
  Filled 2020-06-16: qty 15

## 2020-06-16 MED ORDER — ENOXAPARIN SODIUM 40 MG/0.4ML ~~LOC~~ SOLN
40.0000 mg | SUBCUTANEOUS | Status: DC
  Administered 2020-06-17 – 2020-06-19 (×3): 40 mg via SUBCUTANEOUS
  Filled 2020-06-16 (×3): qty 0.4

## 2020-06-16 MED ORDER — CEFAZOLIN SODIUM-DEXTROSE 2-4 GM/100ML-% IV SOLN
2.0000 g | Freq: Three times a day (TID) | INTRAVENOUS | Status: AC
  Administered 2020-06-16 – 2020-06-17 (×3): 2 g via INTRAVENOUS
  Filled 2020-06-16 (×3): qty 100

## 2020-06-16 MED ORDER — ALBUMIN HUMAN 5 % IV SOLN: INTRAVENOUS | Status: DC | PRN

## 2020-06-16 MED ORDER — OXYCODONE HCL 5 MG/5ML PO SOLN: 5.0000 mg | Freq: Once | ORAL | Status: DC | PRN

## 2020-06-16 MED ORDER — FENTANYL CITRATE (PF) 250 MCG/5ML IJ SOLN
INTRAMUSCULAR | Status: AC
  Filled 2020-06-16: qty 5

## 2020-06-16 MED ORDER — ONDANSETRON HCL 4 MG/2ML IJ SOLN
INTRAMUSCULAR | Status: AC
  Filled 2020-06-16: qty 2

## 2020-06-16 MED ORDER — GABAPENTIN 100 MG PO CAPS
100.0000 mg | ORAL_CAPSULE | Freq: Three times a day (TID) | ORAL | Status: DC
  Administered 2020-06-16 – 2020-06-19 (×9): 100 mg via ORAL
  Filled 2020-06-16 (×9): qty 1

## 2020-06-16 MED ORDER — 0.9 % SODIUM CHLORIDE (POUR BTL) OPTIME
TOPICAL | Status: DC | PRN
  Administered 2020-06-16: 1000 mL

## 2020-06-16 MED ORDER — FENTANYL CITRATE (PF) 100 MCG/2ML IJ SOLN
25.0000 ug | INTRAMUSCULAR | Status: DC | PRN
  Administered 2020-06-16 (×3): 25 ug via INTRAVENOUS
  Administered 2020-06-16: 50 ug via INTRAVENOUS

## 2020-06-16 MED ORDER — SUGAMMADEX SODIUM 200 MG/2ML IV SOLN
INTRAVENOUS | Status: DC | PRN
  Administered 2020-06-16: 200 mg via INTRAVENOUS

## 2020-06-16 MED ORDER — LIDOCAINE 2% (20 MG/ML) 5 ML SYRINGE
INTRAMUSCULAR | Status: DC | PRN
  Administered 2020-06-16: 60 mg via INTRAVENOUS

## 2020-06-16 MED ORDER — PHENYLEPHRINE 40 MCG/ML (10ML) SYRINGE FOR IV PUSH (FOR BLOOD PRESSURE SUPPORT)
PREFILLED_SYRINGE | INTRAVENOUS | Status: AC
  Filled 2020-06-16: qty 10

## 2020-06-16 MED ORDER — OXYCODONE HCL 5 MG PO TABS: 5.0000 mg | ORAL_TABLET | Freq: Once | ORAL | Status: DC | PRN

## 2020-06-16 MED ORDER — CHLORHEXIDINE GLUCONATE 0.12 % MT SOLN
15.0000 mL | OROMUCOSAL | Status: AC
  Filled 2020-06-16: qty 15

## 2020-06-16 SURGICAL SUPPLY — 81 items
ADH SKN CLS APL DERMABOND .7 (GAUZE/BANDAGES/DRESSINGS) ×1
APL PRP STRL LF DISP 70% ISPRP (MISCELLANEOUS) ×1
BIT DRILL CALIBRATED 4.2 (BIT) IMPLANT
BIT DRILL CANN LRG QC 5X300 (BIT) ×2 IMPLANT
BIT DRILL SHORT 4.2 (BIT) IMPLANT
BLADE SURG 10 STRL SS (BLADE) ×6 IMPLANT
BNDG COHESIVE 4X5 TAN STRL (GAUZE/BANDAGES/DRESSINGS) ×3 IMPLANT
BNDG ELASTIC 4X5.8 VLCR STR LF (GAUZE/BANDAGES/DRESSINGS) ×3 IMPLANT
BNDG ELASTIC 6X5.8 VLCR STR LF (GAUZE/BANDAGES/DRESSINGS) ×3 IMPLANT
BNDG GAUZE ELAST 4 BULKY (GAUZE/BANDAGES/DRESSINGS) ×3 IMPLANT
BRUSH SCRUB EZ PLAIN DRY (MISCELLANEOUS) ×6 IMPLANT
CHLORAPREP W/TINT 26 (MISCELLANEOUS) ×3 IMPLANT
COVER PERINEAL POST (MISCELLANEOUS) ×3 IMPLANT
COVER SURGICAL LIGHT HANDLE (MISCELLANEOUS) ×6 IMPLANT
COVER WAND RF STERILE (DRAPES) ×3 IMPLANT
DERMABOND ADVANCED (GAUZE/BANDAGES/DRESSINGS) ×2
DERMABOND ADVANCED .7 DNX12 (GAUZE/BANDAGES/DRESSINGS) ×1 IMPLANT
DRAPE C-ARM 35X43 STRL (DRAPES) ×3 IMPLANT
DRAPE C-ARM 42X72 X-RAY (DRAPES) ×3 IMPLANT
DRAPE C-ARMOR (DRAPES) ×3 IMPLANT
DRAPE HALF SHEET 40X57 (DRAPES) ×6 IMPLANT
DRAPE IMP U-DRAPE 54X76 (DRAPES) ×6 IMPLANT
DRAPE INCISE IOBAN 66X45 STRL (DRAPES) ×3 IMPLANT
DRAPE ORTHO SPLIT 77X108 STRL (DRAPES) ×6
DRAPE STERI IOBAN 125X83 (DRAPES) ×3 IMPLANT
DRAPE SURG 17X23 STRL (DRAPES) ×6 IMPLANT
DRAPE SURG ORHT 6 SPLT 77X108 (DRAPES) ×2 IMPLANT
DRAPE U-SHAPE 47X51 STRL (DRAPES) ×3 IMPLANT
DRILL BIT CALIBRATED 4.2 (BIT) ×3
DRILL BIT SHORT 4.2 (BIT) ×6
DRSG ADAPTIC 3X8 NADH LF (GAUZE/BANDAGES/DRESSINGS) ×3 IMPLANT
DRSG MEPILEX BORDER 4X4 (GAUZE/BANDAGES/DRESSINGS) ×3 IMPLANT
DRSG MEPILEX BORDER 4X8 (GAUZE/BANDAGES/DRESSINGS) ×3 IMPLANT
ELECT REM PT RETURN 9FT ADLT (ELECTROSURGICAL) ×3
ELECTRODE REM PT RTRN 9FT ADLT (ELECTROSURGICAL) ×1 IMPLANT
GAUZE SPONGE 4X4 12PLY STRL (GAUZE/BANDAGES/DRESSINGS) ×3 IMPLANT
GAUZE SPONGE 4X4 16PLY XRAY LF (GAUZE/BANDAGES/DRESSINGS) ×2 IMPLANT
GLOVE BIO SURGEON STRL SZ 6.5 (GLOVE) ×6 IMPLANT
GLOVE BIO SURGEON STRL SZ7.5 (GLOVE) ×12 IMPLANT
GLOVE BIO SURGEONS STRL SZ 6.5 (GLOVE) ×3
GLOVE BIOGEL PI IND STRL 6.5 (GLOVE) ×1 IMPLANT
GLOVE BIOGEL PI IND STRL 7.5 (GLOVE) ×1 IMPLANT
GLOVE BIOGEL PI INDICATOR 6.5 (GLOVE) ×2
GLOVE BIOGEL PI INDICATOR 7.5 (GLOVE) ×2
GOWN STRL REUS W/ TWL LRG LVL3 (GOWN DISPOSABLE) ×2 IMPLANT
GOWN STRL REUS W/TWL LRG LVL3 (GOWN DISPOSABLE) ×6
GUIDEWIRE 3.2X400 (WIRE) ×4 IMPLANT
GUIDEWIRE THREADED 2.8 (WIRE) ×6 IMPLANT
KIT BASIN OR (CUSTOM PROCEDURE TRAY) ×3 IMPLANT
KIT TURNOVER KIT B (KITS) ×3 IMPLANT
MANIFOLD NEPTUNE II (INSTRUMENTS) ×3 IMPLANT
NAIL TFNA 9 130D 420 LEFT (Nail) ×2 IMPLANT
NAIL TIBIAL W/PROX BEND 10X345 (Nail) ×2 IMPLANT
NS IRRIG 1000ML POUR BTL (IV SOLUTION) ×3 IMPLANT
PACK GENERAL/GYN (CUSTOM PROCEDURE TRAY) ×3 IMPLANT
PACK TOTAL JOINT (CUSTOM PROCEDURE TRAY) ×3 IMPLANT
PAD ARMBOARD 7.5X6 YLW CONV (MISCELLANEOUS) ×6 IMPLANT
PADDING CAST COTTON 6X4 STRL (CAST SUPPLIES) ×2 IMPLANT
REAMER ROD DEEP FLUTE 2.5X950 (INSTRUMENTS) ×2 IMPLANT
SCREW CANN TI 32 THRD 6.5X85 (Screw) ×2 IMPLANT
SCREW CANN TI 32 THRD 6.5X95 (Screw) ×2 IMPLANT
SCREW FEMORAL NECK PERF 90 (Screw) ×2 IMPLANT
SCREW LOCK STAR 5X40 (Screw) ×2 IMPLANT
SCREW LOCK STAR 5X42 (Screw) ×2 IMPLANT
SCREW LOCK STAR 5X46 (Screw) ×2 IMPLANT
SCREW LOCK STAR 5X48 (Screw) ×4 IMPLANT
SCREW LOCK STAR 5X52 (Screw) ×2 IMPLANT
SCREW LOCK STAR 5X70 (Screw) ×2 IMPLANT
SPLINT PLASTER CAST XFAST 5X30 (CAST SUPPLIES) IMPLANT
SPLINT PLASTER XFAST SET 5X30 (CAST SUPPLIES) ×2
STAPLER VISISTAT 35W (STAPLE) ×3 IMPLANT
SUT MNCRL AB 3-0 PS2 18 (SUTURE) ×3 IMPLANT
SUT VIC AB 0 CT1 27 (SUTURE)
SUT VIC AB 0 CT1 27XBRD ANBCTR (SUTURE) IMPLANT
SUT VIC AB 2-0 CT1 27 (SUTURE) ×6
SUT VIC AB 2-0 CT1 TAPERPNT 27 (SUTURE) ×2 IMPLANT
TOWEL GREEN STERILE (TOWEL DISPOSABLE) ×6 IMPLANT
TOWEL GREEN STERILE FF (TOWEL DISPOSABLE) ×3 IMPLANT
WASHER 13.0MM (Orthopedic Implant) ×2 IMPLANT
WATER STERILE IRR 1000ML POUR (IV SOLUTION) ×3 IMPLANT
YANKAUER SUCT BULB TIP NO VENT (SUCTIONS) IMPLANT

## 2020-06-16 NOTE — Anesthesia Procedure Notes (Signed)
Procedure Name: Intubation Date/Time: 06/16/2020 8:04 AM Performed by: Dorthea Cove, CRNA Pre-anesthesia Checklist: Patient identified, Emergency Drugs available, Suction available and Patient being monitored Patient Re-evaluated:Patient Re-evaluated prior to induction Oxygen Delivery Method: Circle system utilized Preoxygenation: Pre-oxygenation with 100% oxygen Induction Type: IV induction Ventilation: Two handed mask ventilation required and Mask ventilation without difficulty Laryngoscope Size: Mac and 4 Grade View: Grade II Tube type: Oral Tube size: 7.5 mm Number of attempts: 1 Airway Equipment and Method: Stylet and Oral airway Placement Confirmation: ETT inserted through vocal cords under direct vision,  positive ETCO2 and breath sounds checked- equal and bilateral Secured at: 22 cm Tube secured with: Tape Dental Injury: Teeth and Oropharynx as per pre-operative assessment
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
## 2020-06-16 NOTE — Progress Notes (Signed)
Orthopedic Tech Progress Note Patient Details:  Kyle Spencer Jul 13, 1976 [id number] Reapplied traction to external fixator for patient's left leg injury. I have informed the patient's nurse and also the doctor that me and also my coworker had never done this type of traction on an ex-fix. Spoke with the doctor and followed his orders on how to put it on. Patient is now in traction with no signs of discomfort after having it placed.  Patient ID: Kyle Spencer, male   DOB: [birth year], 44 y.o.   MRN: [id number]   Kyle Spencer 06/16/2020, 6:35 AM
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
## 2020-06-16 NOTE — Progress Notes (Signed)
Pt politely refused MRI today. He inquired if we were going to move him from his current bed and I said we were. He explained that he was in too much pain at this time. I offered to call his nurse for meds but he said he would prefer to to wait until tomorrow.

## 2020-06-16 NOTE — Progress Notes (Signed)
Patient arrival to 5N23 via hospital bed from PACU . Patient in bed with external fixator to left lower leg. No distress noted, patient resting quietly,ed in lowest position and call bell within reach. Will continue to monitor

## 2020-06-16 NOTE — Anesthesia Preprocedure Evaluation (Addendum)
Anesthesia Evaluation  Patient identified by MRN, date of birth, ID band Patient awake    Reviewed: Allergy & Precautions, NPO status , Patient's Chart, lab work & pertinent test results  History of Anesthesia Complications Negative for: history of anesthetic complications  Airway Mallampati: II  TM Distance: >3 FB Neck ROM: Full    Dental  (+) Dental Advisory Given, Teeth Intact   Pulmonary Current Smoker and Patient abstained from smoking.,    Pulmonary exam normal        Cardiovascular negative cardio ROS Normal cardiovascular exam     Neuro/Psych negative neurological ROS  negative psych ROS   GI/Hepatic negative GI ROS, (+)     substance abuse  cocaine use,   Endo/Other  negative endocrine ROS  Renal/GU negative Renal ROS     Musculoskeletal negative musculoskeletal ROS (+)   Abdominal   Peds  Hematology  (+) anemia ,   Anesthesia Other Findings Covid test negative   Reproductive/Obstetrics                            Anesthesia Physical Anesthesia Plan  ASA: II  Anesthesia Plan: General   Post-op Pain Management:    Induction: Intravenous  PONV Risk Score and Plan: 2 and Treatment may vary due to age or medical condition, Ondansetron, Dexamethasone and Midazolam  Airway Management Planned: Oral ETT  Additional Equipment: None  Intra-op Plan:   Post-operative Plan: Extubation in OR  Informed Consent: I have reviewed the patients History and Physical, chart, labs and discussed the procedure including the risks, benefits and alternatives for the proposed anesthesia with the patient or authorized representative who has indicated his/her understanding and acceptance.     Dental advisory given  Plan Discussed with: CRNA and Anesthesiologist  Anesthesia Plan Comments:        Anesthesia Quick Evaluation

## 2020-06-16 NOTE — Progress Notes (Signed)
Report given to Isidore Moos charge CRNA .patient will be transported to OR via hospital bed/ telemetry. Consent will be signed in OR
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
## 2020-06-16 NOTE — Plan of Care (Signed)
  Problem: Activity: Goal: Ability to avoid complications of mobility impairment will improve Outcome: Progressing Goal: Ability to tolerate increased activity will improve Outcome: Progressing   Problem: Education: Goal: Verbalization of understanding the information provided will improve Outcome: Progressing   Problem: Coping: Goal: Level of anxiety will decrease Outcome: Progressing   Problem: Physical Regulation: Goal: Postoperative complications will be avoided or minimized Outcome: Progressing   Problem: Respiratory: Goal: Ability to maintain a clear airway will improve Outcome: Progressing   Problem: Pain Management: Goal: Pain level will decrease Outcome: Progressing   Problem: Skin Integrity: Goal: Signs of wound healing will improve Outcome: Progressing   Problem: Tissue Perfusion: Goal: Ability to maintain adequate tissue perfusion will improve Outcome: Progressing   Problem: Education: Goal: Knowledge of General Education information will improve Description: Including pain rating scale, medication(s)/side effects and non-pharmacologic comfort measures Outcome: Progressing   Problem: Health Behavior/Discharge Planning: Goal: Ability to manage health-related needs will improve Outcome: Progressing   Problem: Clinical Measurements: Goal: Ability to maintain clinical measurements within normal limits will improve Outcome: Progressing Goal: Will remain free from infection Outcome: Progressing Goal: Diagnostic test results will improve Outcome: Progressing Goal: Respiratory complications will improve Outcome: Progressing Goal: Cardiovascular complication will be avoided Outcome: Progressing   Problem: Activity: Goal: Risk for activity intolerance will decrease Outcome: Progressing   Problem: Nutrition: Goal: Adequate nutrition will be maintained Outcome: Progressing   Problem: Coping: Goal: Level of anxiety will decrease Outcome: Progressing    Problem: Elimination: Goal: Will not experience complications related to bowel motility Outcome: Progressing Goal: Will not experience complications related to urinary retention Outcome: Progressing   Problem: Pain Managment: Goal: General experience of comfort will improve Outcome: Progressing   Problem: Safety: Goal: Ability to remain free from injury will improve Outcome: Progressing   Problem: Skin Integrity: Goal: Risk for impaired skin integrity will decrease Outcome: Progressing   

## 2020-06-16 NOTE — Transfer of Care (Signed)
Immediate Anesthesia Transfer of Care Note  Patient: Kyle Spencer  Procedure(s) Performed: INTRAMEDULLARY (IM) NAIL INTERTROCHANTRIC (Left ) INTRAMEDULLARY (IM) NAIL TIBIAL (Left )  Patient Location: PACU  Anesthesia Type:General  Level of Consciousness: awake, alert  and oriented  Airway & Oxygen Therapy: Patient Spontanous Breathing  Post-op Assessment: Report given to RN and Post -op Vital signs reviewed and stable  Post vital signs: Reviewed and stable  Last Vitals:  Vitals Value Taken Time  BP 152/79 06/16/20 1110  Temp 37.1 C 06/16/20 1110  Pulse 100 06/16/20 1110  Resp 19 06/16/20 1110  SpO2 96 % 06/16/20 1110  Vitals shown include unvalidated device data.  Last Pain:  Vitals:   06/16/20 0500  TempSrc: Oral  PainSc:          Complications: No complications documented.

## 2020-06-16 NOTE — Progress Notes (Signed)
Late entry- Pt d/c'd from PACU to CT for CT of left ankle. Traction was removed so patient could be placed in CT scanner. Paged ortho tech while pt was in CT to have him place pt back in traction after CT but was unable to reach him. Pt was transported to 5N23 from CT. Upon arrival to 5N23, ortho tech was paged and informed that pt was fresh post op and needed to be placed back in traction. Ortho tech said he had to do a cast and then he would come. RN Verlon Au informed that pt needed to be placed back in traction and that ortho tech was aware.

## 2020-06-17 ENCOUNTER — Encounter (HOSPITAL_COMMUNITY): Payer: Self-pay | Admitting: Orthopaedic Surgery

## 2020-06-17 LAB — CBC
HCT: 27.6 % — ABNORMAL LOW (ref 39.0–52.0)
Hemoglobin: 9.7 g/dL — ABNORMAL LOW (ref 13.0–17.0)
MCH: 32.4 pg (ref 26.0–34.0)
MCHC: 35.1 g/dL (ref 30.0–36.0)
MCV: 92.3 fL (ref 80.0–100.0)
Platelets: 181 10*3/uL (ref 150–400)
RBC: 2.99 MIL/uL — ABNORMAL LOW (ref 4.22–5.81)
RDW: 13.7 % (ref 11.5–15.5)
WBC: 15.7 10*3/uL — ABNORMAL HIGH (ref 4.0–10.5)
nRBC: 0 % (ref 0.0–0.2)

## 2020-06-17 LAB — BASIC METABOLIC PANEL
Anion gap: 11 (ref 5–15)
BUN: 6 mg/dL (ref 6–20)
CO2: 25 mmol/L (ref 22–32)
Calcium: 8.2 mg/dL — ABNORMAL LOW (ref 8.9–10.3)
Chloride: 103 mmol/L (ref 98–111)
Creatinine, Ser: 0.81 mg/dL (ref 0.61–1.24)
GFR, Estimated: >60 mL/min (ref >60)
Glucose, Bld: 130 mg/dL — ABNORMAL HIGH (ref 70–99)
Potassium: 3.8 mmol/L (ref 3.5–5.1)
Sodium: 139 mmol/L (ref 135–145)

## 2020-06-17 LAB — VITAMIN D 25 HYDROXY (VIT D DEFICIENCY, FRACTURES): Vit D, 25-Hydroxy: 10.81 ng/mL — ABNORMAL LOW (ref 30–100)

## 2020-06-17 NOTE — Progress Notes (Signed)
OT Cancellation Note  Patient Details Name: Kyle Spencer MRN: [id number] DOB: 06-Jun-1976   Cancelled Treatment:    Reason Eval/Treat Not Completed: patient sleeping soundly, OT to continue efforts as appropriate.    Jeannetta Cerutti D Assad Harbeson 06/17/2020, 2:22 PM
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
## 2020-06-17 NOTE — Progress Notes (Signed)
ORTHOPAEDIC PROGRESS NOTE  s/p Procedure(s): INTRAMEDULLARY (IM) NAIL LEFT INTERTROCHANTRIC FEMUR FRACTURE INTRAMEDULLARY (IM) NAIL LEFT TIBIAL FRACTURE  on 06/16/2020 with Dr. Jena Gauss  SUBJECTIVE: Reports pain in his left leg. No chest pain. No SOB. No nausea/vomiting. No other complaints. Requesting pain medications and some ginger ale.   OBJECTIVE: PE: General: resting in hospital bed, NAD Right lower extremity: ACE bandage in place covering lacerations. No drainage noted. Intact EHL/TA/GSC. Endorses distal sensation. Warm well perfused foot. Left lower extremity: left hip dressings CDI. Tender to palpation left hip. Short leg splint CDI. Wiggles his toes. Remainder of motor difficult to test due to splint. Compartments compressible. Endorses distal sensation. Warm well perfused foot.    Vitals:   06/16/20 2300 06/17/20 0300  BP: (!) 145/78 (!) 142/73  Pulse: (!) 103 90  Resp: 17 16  Temp: 98.1 F (36.7 C) 98 F (36.7 C)  SpO2: 98% 97%     ASSESSMENT: Kyle Spencer is a 44 y.o. male POD #1  PLAN: Weightbearing: NWB RLE Insicional and dressing care:  - RLE: reinforce as needed  - Left hip: reinforce as needed - Left lower leg: Leave splint in place. Keep clean and dry.  Orthopedic device(s): Short leg splint VTE prophylaxis: Lovenox 40mg  qd  ABLA: Hgb 9.7 today.  Pain control:  1. Gabapentin 100 mg TID 2. Robaxin 500 mg every 6 hours PRN  3. Oxycodone 5-10 mg every 4 hours PRN 4. Oxycodone 10-15 mg every 4 hours PRN 5. Dilaudid 0.5-1 mg every 4 hours severe pain not controlled with PO Follow - up plan: TBD Contact information: After hours and holidays please check [email or web address] for group call information for [facility]  Patient slightly tachycardic this morning. Pulse 103-106. Likely due to pain. Has not had any pain medications since last night. No chest pain, shortness of breath, cough, fever. Will continue to monitor closely.    ,  PA-C 06/17/2020

## 2020-06-17 NOTE — Progress Notes (Signed)
88 Spoke with RN patient is still not willing to come and do MRI.  RN to call us if patient changes his mind.
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
## 2020-06-17 NOTE — Plan of Care (Signed)

## 2020-06-18 ENCOUNTER — Inpatient Hospital Stay (HOSPITAL_COMMUNITY): Payer: 59

## 2020-06-18 ENCOUNTER — Encounter (HOSPITAL_COMMUNITY): Payer: Self-pay | Admitting: Orthopaedic Surgery

## 2020-06-18 LAB — BASIC METABOLIC PANEL
Anion gap: 7 (ref 5–15)
BUN: 5 mg/dL — ABNORMAL LOW (ref 6–20)
CO2: 25 mmol/L (ref 22–32)
Calcium: 8.2 mg/dL — ABNORMAL LOW (ref 8.9–10.3)
Chloride: 101 mmol/L (ref 98–111)
Creatinine, Ser: 0.77 mg/dL (ref 0.61–1.24)
GFR, Estimated: >60 mL/min (ref >60)
Glucose, Bld: 120 mg/dL — ABNORMAL HIGH (ref 70–99)
Potassium: 3.5 mmol/L (ref 3.5–5.1)
Sodium: 133 mmol/L — ABNORMAL LOW (ref 135–145)

## 2020-06-18 LAB — CBC
HCT: 26.6 % — ABNORMAL LOW (ref 39.0–52.0)
Hemoglobin: 9 g/dL — ABNORMAL LOW (ref 13.0–17.0)
MCH: 31.7 pg (ref 26.0–34.0)
MCHC: 33.8 g/dL (ref 30.0–36.0)
MCV: 93.7 fL (ref 80.0–100.0)
Platelets: 171 10*3/uL (ref 150–400)
RBC: 2.84 MIL/uL — ABNORMAL LOW (ref 4.22–5.81)
RDW: 13.1 % (ref 11.5–15.5)
WBC: 13.5 10*3/uL — ABNORMAL HIGH (ref 4.0–10.5)
nRBC: 0 % (ref 0.0–0.2)

## 2020-06-18 MED ORDER — NICOTINE 14 MG/24HR TD PT24
14.0000 mg | MEDICATED_PATCH | Freq: Every day | TRANSDERMAL | Status: DC
  Administered 2020-06-18 – 2020-06-19 (×2): 14 mg via TRANSDERMAL
  Filled 2020-06-18 (×2): qty 1

## 2020-06-18 NOTE — Plan of Care (Signed)

## 2020-06-18 NOTE — Progress Notes (Signed)
   06/18/20 1008  OTHER  Substance Abuse Education Offered Yes  Substance abuse interventions Other (must comment) (Patient declined. Patient denied any significant history of alcohol or substance use and declined referral and resources at this time.)  (CAGE-AID) Substance Abuse Screening Tool  Have You Ever Felt You Ought to Cut Down on Your Drinking or Drug Use? 0  Have People Annoyed You By Critizing Your Drinking Or Drug Use? 0  Have You Felt Bad Or Guilty About Your Drinking Or Drug Use? 0  Have You Ever Had a Drink or Used Drugs First Thing In The Morning to Steady Your Nerves or to Get Rid of a Hangover? 0  CAGE-AID Score 0

## 2020-06-18 NOTE — Progress Notes (Signed)
ORTHOPAEDIC PROGRESS NOTE  s/p Procedure(s): INTRAMEDULLARY (IM) NAIL LEFT INTERTROCHANTRIC FEMUR FRACTURE INTRAMEDULLARY (IM) NAIL LEFT TIBIAL FRACTURE  on 06/16/2020 with Dr. Jena Gauss  SUBJECTIVE: Reports pain in his left leg. He was unable to tolerate pivot, gait, or stair training yesterday with physical therapy. He lives alone in an apartment on the first floor. He says he has friends and family to help him when he gets home. No chest pain. No SOB. No fevers, chills, cough. No nausea/vomiting. Tolerating diet. No difficulty urinating. Has not had a bowel movement. Passing gas.   OBJECTIVE: PE: General: resting in hospital bed, NAD Right lower extremity: ACE bandage in place covering lacerations. No drainage noted. Intact EHL/TA/GSC. Endorses distal sensation. Warm well perfused foot. Left lower extremity: left hip dressings CDI. Tender to palpation left hip. Swelling noted, but compartments compressible. Short leg splint CDI. Wiggles his toes. Remainder of motor difficult to test due to splint. Tender to palpation right knee and lower leg. Compartments compressible. Endorses distal sensation. Warm well perfused foot.    Vitals:   06/18/20 0322 06/18/20 0828  BP: 117/70 110/76  Pulse: 91 92  Resp: 18 16  Temp: 98.3 F (36.8 C) 98.3 F (36.8 C)  SpO2: 95% 100%    ASSESSMENT: Kyle Spencer is a 44 y.o. male POD #2  PLAN: Weightbearing: NWB RLE Insicional and dressing care:  - RLE: reinforce as needed  - Left hip: reinforce as needed - Left lower leg: Leave splint in place. Keep clean and dry.  Orthopedic device(s): Short leg splint VTE prophylaxis: Lovenox 40mg  qd  ABLA: Hgb 9.0 today. Hemodynamically stable.  Pain control:  1. Gabapentin 100 mg TID 2. Robaxin 500 mg every 6 hours PRN  3. Oxycodone 5-10 mg every 4 hours PRN 4. Oxycodone 10-15 mg every 4 hours PRN 5. Dilaudid 0.5-1 mg every 4 hours severe pain not controlled with PO Follow - up plan: TBD. Pt recommending  home health yesterday, but per note: "Unable to tolerate pivot, gait, or stair training at this time. Anticipate he will improve functionally as pain settles down as he healthy and strong, however if he fails to meet functional goals, may require some intermediate rehabilitation to get home safely." Requested [facility] consult.  Contact information: After hours and holidays please check [email or web address] for group call information for [facility]  Patient's tachycardia has improved today. Likely due to pain yesterday.   [facility], PA-C 06/18/2020

## 2020-06-18 NOTE — Plan of Care (Signed)

## 2020-06-18 NOTE — Anesthesia Postprocedure Evaluation (Signed)
Anesthesia Post Note  Patient: Darrious Youman  Procedure(s) Performed: IRRIGATION AND DEBRIDEMENT and  EXTERNAL FIXATION OF LEFT ANKLE (Left Ankle) IRRIGATION AND DEBRIDEMENT RIGHT LOWER LEG (Right Leg Lower)     Patient location during evaluation: PACU Anesthesia Type: General Level of consciousness: awake and alert Pain management: pain level controlled Vital Signs Assessment: post-procedure vital signs reviewed and stable Respiratory status: spontaneous breathing, nonlabored ventilation, respiratory function stable and patient connected to nasal cannula oxygen Cardiovascular status: blood pressure returned to baseline and stable Postop Assessment: no apparent nausea or vomiting Anesthetic complications: no   No complications documented.  Last Vitals:  Vitals:   06/18/20 0322 06/18/20 0828  BP: 117/70 110/76  Pulse: 91 92  Resp: 18 16  Temp: 36.8 C 36.8 C  SpO2: 95% 100%    Last Pain:  Vitals:   06/18/20 0923  TempSrc:   PainSc: 6                  Sohrab Keelan S

## 2020-06-19 ENCOUNTER — Encounter (HOSPITAL_COMMUNITY): Payer: Self-pay | Admitting: Student

## 2020-06-19 ENCOUNTER — Encounter (HOSPITAL_COMMUNITY): Payer: Self-pay | Admitting: Emergency Medicine

## 2020-06-19 ENCOUNTER — Other Ambulatory Visit (HOSPITAL_COMMUNITY): Payer: Self-pay | Admitting: Student

## 2020-06-19 LAB — BASIC METABOLIC PANEL
Anion gap: 9 (ref 5–15)
BUN: 7 mg/dL (ref 6–20)
CO2: 25 mmol/L (ref 22–32)
Calcium: 8.5 mg/dL — ABNORMAL LOW (ref 8.9–10.3)
Chloride: 99 mmol/L (ref 98–111)
Creatinine, Ser: 0.8 mg/dL (ref 0.61–1.24)
GFR, Estimated: >60 mL/min (ref >60)
Glucose, Bld: 117 mg/dL — ABNORMAL HIGH (ref 70–99)
Potassium: 3.6 mmol/L (ref 3.5–5.1)
Sodium: 133 mmol/L — ABNORMAL LOW (ref 135–145)

## 2020-06-19 MED ORDER — GABAPENTIN 100 MG PO CAPS
100.0000 mg | ORAL_CAPSULE | Freq: Three times a day (TID) | ORAL | 0 refills | Status: DC
Start: 2020-06-19 — End: 2020-06-19

## 2020-06-19 MED ORDER — DOCUSATE SODIUM 100 MG PO CAPS
100.0000 mg | ORAL_CAPSULE | Freq: Two times a day (BID) | ORAL | 0 refills | Status: DC
Start: 2020-06-19 — End: 2020-06-19

## 2020-06-19 MED ORDER — OXYCODONE HCL 5 MG PO TABS
5.0000 mg | ORAL_TABLET | ORAL | 0 refills | Status: DC | PRN
Start: 2020-06-19 — End: 2020-06-19

## 2020-06-19 MED ORDER — ACETAMINOPHEN 500 MG PO TABS: 1000.0000 mg | ORAL_TABLET | Freq: Three times a day (TID) | ORAL | 0 refills | Status: DC

## 2020-06-19 MED ORDER — ACETAMINOPHEN 325 MG PO TABS
650.0000 mg | ORAL_TABLET | Freq: Four times a day (QID) | ORAL | Status: DC
  Administered 2020-06-19: 650 mg via ORAL
  Filled 2020-06-19: qty 2

## 2020-06-19 MED ORDER — ENOXAPARIN SODIUM 40 MG/0.4ML ~~LOC~~ SOLN: 40.0000 mg | SUBCUTANEOUS | 0 refills | Status: DC

## 2020-06-19 MED FILL — ENOXAPARIN SODIUM 40 MG/0.4: 40 | 28 days supply | Qty: 11 | Fill #0

## 2020-06-19 MED FILL — oxyCODONE HCL 5 MG TABS: 5 | 5 days supply | Qty: 60 | Fill #0

## 2020-06-19 MED FILL — DOCUSATE SODIUM 100 MG CAPS: 100 | 5 days supply | Qty: 10 | Fill #0

## 2020-06-19 MED FILL — ACETAMINOPHEN 500MG XT STRE: 500 | 20 days supply | Qty: 60 | Fill #0

## 2020-06-19 MED FILL — GABAPENTIN 100 MG CAPSULE: 100 | 10 days supply | Qty: 30 | Fill #0

## 2020-06-19 NOTE — Progress Notes (Signed)
Pt was given his AVS discharge summary and went over with him. IV was removed with catheter intact. Pt equipment was sent with him. Pt had no further questions.
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
## 2020-06-19 NOTE — Discharge Instructions (Signed)
Truitt Merle, MD [place] PA-C [facility] [address] [phone] Jani Files)   [phone] (fax)                                  POST-OPERATIVE INSTRUCTIONS     WEIGHT BEARING STATUS: Non-weightbearing left leg  RANGE OF MOTION/ACTIVITY:  Ok for hip and knee motion as tolerated  WOUND CARE ? Please keep splint clean dry and intact until follow-up. If your splint gets wet for any reason please contact the office immediately.  Do not stick anything down your splint such as pencils, momey, hangers to try and scratch yourself.  If you feel itchy take Benadryl as prescribed on the bottle for itching ? You may shower on Post-Op Day #2.  ? You must keep splint dry during this process and may find that a plastic bag taped around the extremity or alternatively a towel based bath may be a better option.   ? If you get your splint wet or if it is damaged please contact our clinic.  EXERCISES ? Due to your splint being in place you will not be able to bear weight through your extremity.   ? DO NOT PUT ANY WEIGHT ON YOUR OPERATIVE LEG ? Please use crutches or a walker to avoid weight bearing.   DVT/PE prophylaxis: Lovenox  DIET: As you were eating previously.  Can use over the counter stool softeners and bowel preparations, such as Miralax, to help with bowel movements.  Narcotics can be constipating.  Be sure to drink plenty of fluids  REGIONAL ANESTHESIA (NERVE BLOCKS) . The anesthesia team may have performed a nerve block for you if safe in the setting of your care.  This is a great tool used to minimize pain.  Typically the block may start wearing off overnight but the long acting medicine may last for 3-4 days.  The nerve block wearing off can be a challenging period but please utilize your as needed pain medications to try and manage this period.    POST-OP MEDICATIONS- Multimodal approach to pain control . In general your pain will be controlled with a  combination of substances.  Prescriptions unless otherwise discussed are electronically sent to your pharmacy.  This is a carefully made plan we use to minimize narcotic use.     - Meloxicam OR Celebrex - Anti-inflammatory medication taken on a scheduled basis  - Acetaminophen - Non-narcotic pain medicine taken on a scheduled basis   - Oxycodone - This is a strong narcotic, to be used only on an "as needed" basis for pain.  -  Aspirin 81mg  - This medicine is used to minimize the risk of blood clots after surgery.             -          Zofran - take as needed for nausea   FOLLOW-UP ? If you develop a Fever (>101.5), Redness or Drainage from the surgical incision site, please call our office to arrange for an evaluation. ? Please call the office to schedule a follow-up appointment for your incision check if you do not already have one, 7-10 days post-operatively.   VISIT OUR WEBSITE FOR ADDITIONAL INFORMATION: [email or web address]   HELPFUL INFORMATION  ? If you had a block, it will wear off between 8-24 hrs postop typically.  This is period when your pain may go from nearly zero to the  pain you would have had postop without the block.  This is an abrupt transition but nothing dangerous is happening.  You may take an extra dose of narcotic when this happens.  ? You should wean off your narcotic medicines as soon as you are able.  Most patients will be off or using minimal narcotics before their first postop appointment.   ? We suggest you use the pain medication the first night prior to going to bed, in order to ease any pain when the anesthesia wears off. You should avoid taking pain medications on an empty stomach as it will make you nauseous.  ? Do not drink alcoholic beverages or take illicit drugs when taking pain medications.  ? In most states it is against the law to drive while you are in a splint or sling.  And certainly against the law to drive while taking narcotics.  ? You may  return to work/school in the next couple of days when you feel up to it.   ? Pain medication may make you constipated.  Below are a few solutions to try in this order: - Decrease the amount of pain medication if you aren't having pain. - Drink lots of decaffeinated fluids. - Drink prune juice and/or each dried prunes  o If the first 3 don't work start with additional solutions - Take Colace - an over-the-counter stool softener - Take Senokot - an over-the-counter laxative - Take Miralax - a stronger over-the-counter laxative

## 2020-06-19 NOTE — TOC Initial Note (Signed)
Transition of Care [facility]) - Initial/Assessment Note    Patient Details  Name: Kyle Spencer MRN: [id number] Date of Birth: 14-Jan-1976  [facility]) CM/SW Contact:    Curlene Labrum, RN Phone Number: 06/19/2020, 11:34 AM  Clinical Narrative:                 Case management met with the patient today at the bedside to discuss transitions of care to home.  The patient lives at home alone but plans on getting assistance from family and friends at his apartment - [place], [place], [place] [address] - phone number [phone].   The patient states that he currently has a wheelchair and bedside commode and will only need a rolling walker for home.  The RW was given to him from the [address] supply closet.  The patient was given choice regarding home health services and did not have a preference.  I called [facility] and they have accepted him for admission for home health PT.  The patient's family is bring their credit card to pay for the balance of the medications coming from [place].  The patient does not have a PCP and was set up with [facility] for hospital follow up.  The patient plans on being discharged today with family once his medications arrive from the [facility].  Expected Discharge Plan: [place] Barriers to Discharge: No Barriers Identified   Patient Goals and CMS Choice Patient states their goals for this hospitalization and ongoing recovery are:: Patient plans to discharge home with assistance from friends and family. CMS Medicare.gov Compare Post Acute Care list provided to:: Patient Choice offered to / list presented to : Patient  Expected Discharge Plan and Services Expected Discharge Plan: [place]   Discharge Planning Services: CM Consult Post Acute Care Choice: Durable Medical Equipment, Home Health Living arrangements for the past 2 months: Apartment Expected Discharge  Date: 06/19/20               DME Arranged: Gilford Rile rolling DME Agency: [employer] Date DME Agency Contacted: 06/19/20 Time DME Agency Contacted: [phone] Representative spoke with at DME Agency: Rw obtained from supply closet on [address]. HH Arranged: PT HH Agency: [facility] (now [facility]) Date [place]: 06/19/20 Time [place]: 57 Representative spoke with at [place]: Drue Novel, RNCM  Prior Living Arrangements/Services Living arrangements for the past 2 months: [place] with:: Self Patient language and need for interpreter reviewed:: Yes Do you feel safe going back to the place where you live?: Yes      Need for Family Participation in Patient Care: Yes (Comment) Care giver support system in place?: Yes (comment) Current home services: DME (Patient currently has a wheelchair, 3:1 at home and declines WC from the hospital) Criminal Activity/Legal Involvement Pertinent to Current Situation/Hospitalization: No - Comment as needed  Activities of Daily Living Home Assistive Devices/Equipment: None ADL Screening (condition at time of admission) Patient's cognitive ability adequate to safely complete daily activities?: No Is the patient deaf or have difficulty hearing?: No Does the patient have difficulty seeing, even when wearing glasses/contacts?: No Does the patient have difficulty concentrating, remembering, or making decisions?: No Patient able to express need for assistance with ADLs?: No Does the patient have difficulty dressing or bathing?: No Independently performs ADLs?: Yes (appropriate for developmental age) Does the patient have difficulty walking or climbing  stairs?: No Weakness of Legs: Left Weakness of Arms/Hands: None  Permission Sought/Granted Permission sought to share information with : Case Manager Permission granted to share information with : Yes, Verbal Permission Granted     Permission granted to share info w  AGENCY: [place], [employer] for [place] granted to share info w Relationship: mother - Santiago Glad     Emotional Assessment Appearance:: Appears stated age Attitude/Demeanor/Rapport: Gracious Affect (typically observed): Accepting Orientation: : Oriented to Self, Oriented to Place, Oriented to  Time, Oriented to Situation Alcohol / Substance Use: Not Applicable Psych Involvement: No (comment)  Admission diagnosis:  Trauma [T14.90XA] Open displaced comminuted fracture of shaft of left tibia [S82.252B] Patient Active Problem List   Diagnosis Date Noted  . Subtrochanteric fracture of left femur ([place]) 06/16/2020  . Fracture of femoral neck, left ([place]) 06/16/2020  . Tibia/fibula fracture, left, open type I or II, initial encounter 06/15/2020  . Open displaced comminuted fracture of shaft of left tibia 06/15/2020   PCP:  No primary care provider on file. Pharmacy:   [facility] [address] ([address] [place] [address] [place] ([place]) [place] [address] Phone: [phone] Fax: [phone]  [facility] [place], [place] - [address] [place] [address] Phone: [phone] Fax: [phone]     Social Determinants of Health (SDOH) Interventions    Readmission Risk Interventions Readmission Risk Prevention Plan 06/19/2020  Post Dischage Appt Complete  Medication Screening Complete  Transportation Screening Complete
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
## 2020-06-19 NOTE — Plan of Care (Signed)
Patient is alert and oriented x4. Patient up to the edge of the bed. Pain managed with oxycodone and robaxin. In no acute distress. Problem: Activity: Goal: Ability to avoid complications of mobility impairment will improve Outcome: Progressing Goal: Ability to tolerate increased activity will improve Outcome: Progressing   Problem: Education: Goal: Verbalization of understanding the information provided will improve Outcome: Progressing   Problem: Coping: Goal: Level of anxiety will decrease Outcome: Progressing   Problem: Physical Regulation: Goal: Postoperative complications will be avoided or minimized Outcome: Progressing   Problem: Respiratory: Goal: Ability to maintain a clear airway will improve Outcome: Progressing   Problem: Pain Management: Goal: Pain level will decrease Outcome: Progressing   Problem: Skin Integrity: Goal: Signs of wound healing will improve Outcome: Progressing   Problem: Tissue Perfusion: Goal: Ability to maintain adequate tissue perfusion will improve Outcome: Progressing   Problem: Education: Goal: Knowledge of General Education information will improve Description: Including pain rating scale, medication(s)/side effects and non-pharmacologic comfort measures Outcome: Progressing   Problem: Health Behavior/Discharge Planning: Goal: Ability to manage health-related needs will improve Outcome: Progressing   Problem: Clinical Measurements: Goal: Ability to maintain clinical measurements within normal limits will improve Outcome: Progressing Goal: Will remain free from infection Outcome: Progressing Goal: Diagnostic test results will improve Outcome: Progressing Goal: Respiratory complications will improve Outcome: Progressing Goal: Cardiovascular complication will be avoided Outcome: Progressing   Problem: Activity: Goal: Risk for activity intolerance will decrease Outcome: Progressing   Problem: Nutrition: Goal: Adequate  nutrition will be maintained Outcome: Progressing   Problem: Coping: Goal: Level of anxiety will decrease Outcome: Progressing   Problem: Elimination: Goal: Will not experience complications related to bowel motility Outcome: Progressing Goal: Will not experience complications related to urinary retention Outcome: Progressing   Problem: Pain Managment: Goal: General experience of comfort will improve Outcome: Progressing   Problem: Safety: Goal: Ability to remain free from injury will improve Outcome: Progressing   Problem: Skin Integrity: Goal: Risk for impaired skin integrity will decrease Outcome: Progressing
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
## 2020-06-20 NOTE — Anesthesia Postprocedure Evaluation (Signed)
Anesthesia Post Note  Patient: Kyle Spencer  Procedure(s) Performed: INTRAMEDULLARY (IM) NAIL INTERTROCHANTRIC (Left ) INTRAMEDULLARY (IM) NAIL TIBIAL (Left )     Patient location during evaluation: PACU Anesthesia Type: General Level of consciousness: awake and alert Pain management: pain level controlled Vital Signs Assessment: post-procedure vital signs reviewed and stable Respiratory status: spontaneous breathing, nonlabored ventilation and respiratory function stable Cardiovascular status: blood pressure returned to baseline and stable Postop Assessment: no apparent nausea or vomiting Anesthetic complications: no   No complications documented.  Last Vitals:  Vitals:   06/19/20 0304 06/19/20 0744  BP: 124/72 124/68  Pulse: 96 (!) 102  Resp: 16 20  Temp: 36.7 C 36.8 C  SpO2: 100% 100%    Last Pain:  Vitals:   06/19/20 0821  TempSrc:   PainSc: 4                  Beryle Lathe
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
## 2020-07-12 ENCOUNTER — Ambulatory Visit: Payer: 59 | Attending: Physician Assistant | Admitting: Physician Assistant

## 2020-07-12 ENCOUNTER — Other Ambulatory Visit: Payer: Self-pay

## 2020-07-12 DIAGNOSIS — E871 Hypo-osmolality and hyponatremia: Secondary | ICD-10-CM

## 2020-07-12 DIAGNOSIS — D649 Anemia, unspecified: Secondary | ICD-10-CM

## 2020-07-12 DIAGNOSIS — R739 Hyperglycemia, unspecified: Secondary | ICD-10-CM

## 2020-07-12 DIAGNOSIS — Z09 Encounter for follow-up examination after completed treatment for conditions other than malignant neoplasm: Secondary | ICD-10-CM

## 2020-09-08 ENCOUNTER — Other Ambulatory Visit: Payer: Self-pay

## 2020-09-08 ENCOUNTER — Ambulatory Visit: Payer: 59 | Attending: Student

## 2020-09-08 DIAGNOSIS — M25672 Stiffness of left ankle, not elsewhere classified: Secondary | ICD-10-CM | POA: Diagnosis present

## 2020-09-08 DIAGNOSIS — M6281 Muscle weakness (generalized): Secondary | ICD-10-CM

## 2020-09-08 DIAGNOSIS — S82292S Other fracture of shaft of left tibia, sequela: Secondary | ICD-10-CM | POA: Diagnosis present

## 2020-09-08 DIAGNOSIS — M25662 Stiffness of left knee, not elsewhere classified: Secondary | ICD-10-CM | POA: Diagnosis present

## 2020-09-08 DIAGNOSIS — S72142S Displaced intertrochanteric fracture of left femur, sequela: Secondary | ICD-10-CM | POA: Insufficient documentation

## 2020-09-08 DIAGNOSIS — R262 Difficulty in walking, not elsewhere classified: Secondary | ICD-10-CM | POA: Diagnosis present

## 2020-09-08 DIAGNOSIS — R2689 Other abnormalities of gait and mobility: Secondary | ICD-10-CM | POA: Diagnosis present

## 2020-09-08 DIAGNOSIS — S82402D Unspecified fracture of shaft of left fibula, subsequent encounter for closed fracture with routine healing: Secondary | ICD-10-CM | POA: Diagnosis present

## 2020-09-08 DIAGNOSIS — M79605 Pain in left leg: Secondary | ICD-10-CM | POA: Insufficient documentation
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
## 2020-09-12 ENCOUNTER — Ambulatory Visit: Payer: 59 | Attending: Student

## 2020-09-12 ENCOUNTER — Other Ambulatory Visit: Payer: Self-pay

## 2020-09-12 DIAGNOSIS — R2689 Other abnormalities of gait and mobility: Secondary | ICD-10-CM | POA: Insufficient documentation

## 2020-09-12 DIAGNOSIS — R262 Difficulty in walking, not elsewhere classified: Secondary | ICD-10-CM | POA: Insufficient documentation

## 2020-09-12 DIAGNOSIS — S82402D Unspecified fracture of shaft of left fibula, subsequent encounter for closed fracture with routine healing: Secondary | ICD-10-CM

## 2020-09-12 DIAGNOSIS — M25662 Stiffness of left knee, not elsewhere classified: Secondary | ICD-10-CM | POA: Diagnosis present

## 2020-09-12 DIAGNOSIS — M79605 Pain in left leg: Secondary | ICD-10-CM | POA: Diagnosis present

## 2020-09-12 DIAGNOSIS — M6281 Muscle weakness (generalized): Secondary | ICD-10-CM | POA: Diagnosis present

## 2020-09-12 DIAGNOSIS — S82292S Other fracture of shaft of left tibia, sequela: Secondary | ICD-10-CM | POA: Insufficient documentation

## 2020-09-12 DIAGNOSIS — M25672 Stiffness of left ankle, not elsewhere classified: Secondary | ICD-10-CM | POA: Insufficient documentation

## 2020-09-12 DIAGNOSIS — S72142S Displaced intertrochanteric fracture of left femur, sequela: Secondary | ICD-10-CM

## 2020-09-12 NOTE — Therapy (Signed)
[facility] [place], [place], [address] Phone: [phone]   Fax:  [phone]  Physical Therapy Treatment  Patient Details  Name: Kyle Spencer MRN: [id number] Date of Birth: 1976/06/21 Referring Provider (PT): Vivien Rota   Encounter Date: 09/12/2020   PT End of Session - 09/12/20 1138    Visit Number 2    Number of Visits 13    Date for PT Re-Evaluation 10/28/20    Authorization Type [employer]    Progress Note Due on Visit 10    PT Start Time 1139   pt arrived late   PT Stop Time 1221    PT Time Calculation (min) 42 min    Equipment Utilized During Treatment Other (comment)   crutches - arrived with both but used R axillary crutch only throughout session   Activity Tolerance Patient tolerated treatment well    Behavior During Therapy [facility] for tasks assessed/performed           Past Medical History:  Diagnosis Date  . Kidney stone     Past Surgical History:  Procedure Laterality Date  . EXTERNAL FIXATION LEG Left 06/15/2020   Procedure: IRRIGATION AND DEBRIDEMENT and  EXTERNAL FIXATION OF LEFT ANKLE;  Surgeon: Leandrew Koyanagi, MD;  Location: [place];  Service: Orthopedics;  Laterality: Left;  . I & D EXTREMITY Right 06/15/2020   Procedure: IRRIGATION AND DEBRIDEMENT RIGHT LOWER LEG;  Surgeon: Leandrew Koyanagi, MD;  Location: [place];  Service: Orthopedics;  Laterality: Right;  . INTRAMEDULLARY (IM) NAIL INTERTROCHANTERIC Left 06/16/2020   Procedure: INTRAMEDULLARY (IM) NAIL INTERTROCHANTRIC;  Surgeon: Shona Needles, MD;  Location: [place];  Service: Orthopedics;  Laterality: Left;  . KNEE SURGERY    . TIBIA IM NAIL INSERTION Left 06/16/2020   Procedure: INTRAMEDULLARY (IM) NAIL TIBIAL;  Surgeon: Shona Needles, MD;  Location: [place];  Service: Orthopedics;  Laterality: Left;    There were no vitals filed for this visit.   Subjective Assessment - 09/12/20 1138    Subjective "I just left the doctor.  He said it's healing well and to start trying to only use 1 crutch." Pt denies pain upon arrival. He reports having a follow up with doctor in 6 weeks now.    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 No/denies    Pain Score 0-No pain    Pain Onset More than a month ago              [facility] PT Assessment - 09/12/20 0001      Assessment   Medical Diagnosis L femoral neck /subtrochanteric /intertrachanteric femur s/p IMN. fx L open tibia fx s/p IMN, L distal fibula fx    Referring Provider (PT) Delray Alt, PA-C    Onset Date/Surgical Date 06/15/20      Restrictions   Weight Bearing Restrictions No    Other Position/Activity Restrictions WBAT      Strength   Strength Assessment Site Ankle    Right/Left Ankle Left    Left Ankle Dorsiflexion 5/5    Left Ankle Plantar Flexion 5/5   modified test in sitting   Left Ankle Inversion 4/5    Left Ankle Eversion 3+/5                         [facility] Adult  PT Treatment/Exercise - 09/12/20 0001      Ambulation/Gait   Ambulation/Gait Yes    Ambulation/Gait Assistance 6: Modified independent (Device/Increase time)    Ambulation Distance (Feet) 360 Feet    Assistive device R Axillary Crutch    Gait Pattern Step-through pattern    Ambulation Surface Level;Indoor    Gait Comments Reviewed sequencing and heel-to-toe gait. Pt demonstrates R lean during gait with cues to encourage equal weightbearing as tolerated      Exercises   Exercises Knee/Hip;Ankle      Knee/Hip Exercises: Aerobic   Nustep L4 x 5 min LE only      Knee/Hip Exercises: Standing   Terminal Knee Extension Strengthening;Left;2 sets;10 reps    Terminal Knee Extension Limitations 10# with RUE support at freemotion      Knee/Hip Exercises: Supine   Heel Slides AAROM;Left;15 reps    Heel Slides Limitations with green strap; 5 sec hold at end range L knee FL     Straight Leg Raises Strengthening;Left;20 reps      Knee/Hip Exercises: Sidelying   Hip ABduction Strengthening;Left;2 sets;10 reps    Hip ADduction Strengthening;Left;2 sets;10 reps      Knee/Hip Exercises: Prone   Hip Extension Strengthening;Left;2 sets;10 reps      Ankle Exercises: Standing   SLS L SLS 5 x 10 sec with UE at freemotion for support as needed    Heel Raises Both;10 reps    Heel Raises Limitations 2 x 10      Ankle Exercises: Supine   T-Band Eversion with green band x 20                  PT Education - 09/12/20 1232    Education Details Updated HEP, encouraged patient to continue practicing gait using 1 axillary crutch (R side). Reviewed FOTO and potential progress with PT.    Person(s) Educated Patient    Methods Explanation;Demonstration;Tactile cues;Verbal cues;Handout    Comprehension Verbalized understanding;Returned demonstration;Need further instruction            PT Short Term Goals - 09/09/20 2206      PT SHORT TERM GOAL #1   Title Pt will be Ind in an initail HEP    Status New    Target Date 09/30/20      PT SHORT TERM GOAL #2   Title Pt will progress to ambulation with a cane or single crutch s the cam boot    Target Date 09/30/20             PT Long Term Goals - 09/12/20 1237      PT LONG TERM GOAL #1   Title Pt's L knee AROM will increase to 0-125d and L ankle DF to 12d for improved functional mobility and gait    Baseline L knee FL 129 - will plan to reassess L knee EXT next session    Status Partially Met      PT LONG TERM GOAL #2   Title Increase L hip strength to 4+/5, knee to 5/5, and ankle to 5/5 for improved functional mobility and gait    Status Revised      PT LONG TERM GOAL #3   Title Pt's gait will improve to walking s an AD with no to mild limp over the L LE and tolerate walking 1016f for community and work ambulation    Status New      PT LONG TERM GOAL #4   Title Pt will be able  to perform bending and  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 He demonstrates improvement of L knee FL to 129 degrees when assessed after performing heel slides. His L knee EXT always appears improved with ability to perform heel strike during ambulation and good tolerance with TKE 10# at freemotion. He was able to ambulate 360 using R axillary crutch with 2-point gait pattern. He fatigues quickly during interventions with brief rest breaks required between sets. He should continue to benefit from skilled PT intervention to increase LLE functional strength and progress towards eventual ambulation without crutches and return to work.    Personal Factors and Comorbidities Fitness;Profession    Examination-Activity Limitations Locomotion Level;Transfers;Squat;Stairs;Stand;Lift;Carry;Reach Overhead    Examination-Participation Restrictions Occupation    Stability/Clinical Decision Making Stable/Uncomplicated    Rehab Potential Good    PT Frequency 2x / week    PT Duration 6 weeks    PT Treatment/Interventions ADLs/Self Care Home Management;Cryotherapy;Electrical Stimulation;Iontophoresis 50m/ml Dexamethasone;Moist Heat;Ultrasound;DME Instruction;Gait training;Stair training;Functional mobility training;Therapeutic activities;Therapeutic exercise;Balance training;Manual techniques;Patient/family education;Passive range of motion;Dry needling;Taping;Vasopneumatic Device    PT Next Visit Plan Assess resonse to  HEP. TUG vs Berg. Progress ther ex including standing activities. Gait training with 1 crutch and eventually wean off crutches and equal weightbearing improves    PT Home Exercise Plan [id number]    Consulted and Agree with Plan of Care Patient           Patient will benefit from skilled therapeutic intervention in order to improve the following deficits and impairments:  Abnormal gait,Decreased range of motion,Difficulty walking,Decreased activity tolerance,Pain,Decreased balance,Decreased strength  Visit Diagnosis: Closed fracture of shaft of left fibula with routine healing, unspecified fracture morphology, subsequent encounter  Intertrochanteric fracture of left femur, sequela  Other fracture of shaft of left tibia, sequela  Difficulty in walking, not elsewhere classified  Other abnormalities of gait and mobility  Decreased ROM of left knee  Decreased range of motion of left ankle     Problem List Patient Active Problem List   Diagnosis Date Noted  . Subtrochanteric fracture of left femur ([place] 06/16/2020  . Fracture of femoral neck, left ([facility] 06/16/2020  . Tibia/fibula fracture, left, open type I or II, initial encounter 06/15/2020  . Open displaced comminuted fracture of shaft of left tibia 06/15/2020      KHaydee Monica PT, DPT 09/12/20 12:44 PM  [facility] CircleGBells [place] [address]Phone: [phone]  Fax:  [phone] Name: AMATTIE NOVOSELMRN: [id number] of Birth: 211/26/1977

## 2020-09-14 ENCOUNTER — Other Ambulatory Visit: Payer: Self-pay

## 2020-09-14 ENCOUNTER — Ambulatory Visit: Payer: 59

## 2020-09-14 DIAGNOSIS — S82402D Unspecified fracture of shaft of left fibula, subsequent encounter for closed fracture with routine healing: Secondary | ICD-10-CM

## 2020-09-14 DIAGNOSIS — S82292S Other fracture of shaft of left tibia, sequela: Secondary | ICD-10-CM

## 2020-09-14 DIAGNOSIS — M79605 Pain in left leg: Secondary | ICD-10-CM

## 2020-09-14 DIAGNOSIS — M25672 Stiffness of left ankle, not elsewhere classified: Secondary | ICD-10-CM

## 2020-09-14 DIAGNOSIS — S72142S Displaced intertrochanteric fracture of left femur, sequela: Secondary | ICD-10-CM

## 2020-09-14 DIAGNOSIS — M25662 Stiffness of left knee, not elsewhere classified: Secondary | ICD-10-CM

## 2020-09-14 DIAGNOSIS — M6281 Muscle weakness (generalized): Secondary | ICD-10-CM

## 2020-09-14 DIAGNOSIS — R262 Difficulty in walking, not elsewhere classified: Secondary | ICD-10-CM

## 2020-09-14 DIAGNOSIS — R2689 Other abnormalities of gait and mobility: Secondary | ICD-10-CM

## 2020-09-14 NOTE — Therapy (Signed)
[place], [place], [address] Phone: [phone]   Fax:  [phone]  Physical Therapy Treatment  Patient Details  Name: Kyle Spencer MRN: [id number] Date of Birth: 1976/05/03 Referring Provider (PT): Vivien Rota   Encounter Date: 09/14/2020   PT End of Session - 09/14/20 1453    Visit Number 3    Number of Visits 13    Date for PT Re-Evaluation 10/28/20    Authorization Type [employer]    Progress Note Due on Visit 10    PT Start Time 8032    PT Stop Time 1528    PT Time Calculation (min) 42 min    Equipment Utilized During Treatment Other (comment)   crutches - arrived with both but used R axillary crutch only throughout session   Activity Tolerance Patient tolerated treatment well    Behavior During Therapy [facility] for tasks assessed/performed           Past Medical History:  Diagnosis Date  . Kidney stone     Past Surgical History:  Procedure Laterality Date  . EXTERNAL FIXATION LEG Left 06/15/2020   Procedure: IRRIGATION AND DEBRIDEMENT and  EXTERNAL FIXATION OF LEFT ANKLE;  Surgeon: Leandrew Koyanagi, MD;  Location: [place];  Service: Orthopedics;  Laterality: Left;  . I & D EXTREMITY Right 06/15/2020   Procedure: IRRIGATION AND DEBRIDEMENT RIGHT LOWER LEG;  Surgeon: Leandrew Koyanagi, MD;  Location: [place];  Service: Orthopedics;  Laterality: Right;  . INTRAMEDULLARY (IM) NAIL INTERTROCHANTERIC Left 06/16/2020   Procedure: INTRAMEDULLARY (IM) NAIL INTERTROCHANTRIC;  Surgeon: Shona Needles, MD;  Location: [place];  Service: Orthopedics;  Laterality: Left;  . KNEE SURGERY    . TIBIA IM NAIL INSERTION Left 06/16/2020   Procedure: INTRAMEDULLARY (IM) NAIL TIBIAL;  Surgeon: Shona Needles, MD;  Location: [place];  Service: Orthopedics;  Laterality: Left;    There were no vitals filed for this visit.   Subjective Assessment - 09/14/20 1452    Subjective "I have been doing the exercises, and I  had some soreness right here (points to L lateral and anterior thigh) but it's good now." Pt denies pain upon arrival.    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 No/denies    Pain Score 0-No pain    Pain Orientation Left    Pain Descriptors / Indicators Aching    Pain Onset More than a month ago              [facility] PT Assessment - 09/14/20 0001      Assessment   Medical Diagnosis L femoral neck /subtrochanteric /intertrachanteric femur s/p IMN. fx L open tibia fx s/p IMN, L distal fibula fx    Referring Provider (PT) Delray Alt, PA-C    Onset Date/Surgical Date 06/15/20      AROM   Left Knee Extension 0    Left Knee Flexion 130   AAROM at 15th rep of heel slide     Standardized Balance Assessment   Standardized Balance Assessment Timed Up and Go Test      Timed Up and Go Test   TUG Normal TUG    Normal TUG (seconds) 23.54   initial attempt 38.25 seconds with R axillary crutch. 2nd attempt 23.54 seconds with R SPC  [place] Adult PT Treatment/Exercise - 09/14/20 0001      Ambulation/Gait   Ambulation/Gait Yes    Ambulation/Gait Assistance 6: Modified independent (Device/Increase time)    Ambulation Distance (Feet) 180 Feet    Assistive device Straight cane   RUE   Gait Pattern Step-through pattern    Ambulation Surface Level;Indoor      Self-Care   Self-Care Other Self-Care Comments    Other Self-Care Comments  See patient education      Knee/Hip Exercises: Aerobic   Nustep L5 x 5 min LE only      Knee/Hip Exercises: Standing   Functional Squat 2 sets;10 reps    Functional Squat Limitations BUE support at counter      Knee/Hip Exercises: Supine   Heel Slides AAROM;Left;10 reps    Heel Slides Limitations with green strap; 5 sec hold at end range L knee FL    Bridges with [employer] Strengthening;Both;1 set;15 reps    Straight Leg  Raises Strengthening;Left;3 sets;5 reps    Straight Leg Raises Limitations 2# ankle weight    Other Supine Knee/Hip Exercises Ball squeeze x 15      Knee/Hip Exercises: Sidelying   Hip ABduction Strengthening;Left;3 sets;5 reps    Hip ABduction Limitations 2# ankle weight      Ankle Exercises: Standing   SLS L SLS 15 x 5-10 sec with UE at freemotion for support as needed    Heel Raises Both;20 reps                  PT Education - 09/14/20 1538    Education Details HEP, reviewed optimal height for assistive device at wrist level, gait training and sequence with AD    Person(s) Educated Patient    Methods Explanation;Demonstration;Tactile cues;Verbal cues    Comprehension Verbalized understanding;Returned demonstration;Verbal cues required            PT Short Term Goals - 09/09/20 2206      PT SHORT TERM GOAL #1   Title Pt will be Ind in an initail HEP    Status New    Target Date 09/30/20      PT SHORT TERM GOAL #2   Title Pt will progress to ambulation with a cane or single crutch s the cam boot    Target Date 09/30/20             PT Long Term Goals - 09/12/20 1237      PT LONG TERM GOAL #1   Title Pt's L knee AROM will increase to 0-125d and L ankle DF to 12d for improved functional mobility and gait    Baseline L knee FL 129 - will plan to reassess L knee EXT next session    Status Partially Met      PT LONG TERM GOAL #2   Title Increase L hip strength to 4+/5, knee to 5/5, and ankle to 5/5 for improved functional mobility and gait    Status Revised      PT LONG TERM GOAL #3   Title Pt's gait will improve to walking s an AD with no to mild limp over the L LE and tolerate walking 1033f for community and work ambulation    Status New      PT LONG TERM GOAL #4   Title Pt will be able to perform bending and lifting actities 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 He has TTP along L greater trochanter and lateral thigh with soreness in lateral thigh. He expresses during session that he feels more comfortable holding crutch in front of him rather than having it under axilla because he tends to lean to R side and decrease L weightbearing despite knowing he should not lean on crutch. Practiced pushing through handle to reduce R trunk lean and performed gait training with R SPC. Patient expressed feeling comfortable with SPC and states he may practice household ambulation on level floor using a friend's quad cane. Education provided optimal height for assistive device. Pt tolerated session well overall with no adverse effects or complaints of pain. He should continue to benefit from skilled PT for strengthening and return to PLOF.    Personal Factors and Comorbidities Fitness;Profession    Examination-Activity Limitations Locomotion Level;Transfers;Squat;Stairs;Stand;Lift;Carry;Reach Overhead    Examination-Participation Restrictions Occupation    Stability/Clinical Decision Making Stable/Uncomplicated    Rehab Potential Good    PT Frequency 2x / week    PT Duration 6 weeks    PT Treatment/Interventions ADLs/Self Care Home Management;Cryotherapy;Electrical Stimulation;Iontophoresis 47m/ml Dexamethasone;Moist Heat;Ultrasound;DME Instruction;Gait training;Stair training;Functional mobility training;Therapeutic activities;Therapeutic exercise;Balance training;Manual techniques;Patient/family education;Passive range of motion;Dry needling;Taping;Vasopneumatic Device    PT Next Visit Plan  Assess resonse to HEP. Progress ther ex including standing activities. Gait training with 1 crutch vs SPC and eventually wean off crutches and equal weightbearing improves    PT Home Exercise Plan [id number]    Consulted and Agree with Plan of Care Patient           Patient will benefit from skilled therapeutic intervention in order to improve the following deficits and impairments:  Abnormal gait,Decreased range of motion,Difficulty walking,Decreased activity tolerance,Pain,Decreased balance,Decreased strength  Visit Diagnosis: Closed fracture of shaft of left fibula with routine healing, unspecified fracture morphology, subsequent encounter  Intertrochanteric fracture of left femur, sequela  Other fracture of shaft of left tibia, sequela  Difficulty in walking, not elsewhere classified  Other abnormalities of gait and mobility  Decreased ROM of left knee  Decreased range of motion of left ankle  Pain in left leg  Muscle weakness (generalized)     Problem List Patient Active Problem List   Diagnosis Date Noted  . Subtrochanteric fracture of left femur ([place] 06/16/2020  . Fracture of femoral neck, left ([place] 06/16/2020  . Tibia/fibula fracture, left, open type I or II, initial encounter 06/15/2020  . Open displaced comminuted fracture of shaft of left tibia 06/15/2020    KHaydee Monica PT, DPT 09/14/20 3:41 PM  [facility]GHigh[address] [place] [address]Phone: [phone]  Fax:  [phone] Name: Kyle KINNERMRN: [id number] of Birth: 2May 14, 1977

## 2020-09-25 ENCOUNTER — Ambulatory Visit: Payer: 59

## 2020-09-27 ENCOUNTER — Ambulatory Visit: Payer: 59

## 2020-09-27 ENCOUNTER — Telehealth: Payer: Self-pay

## 2020-09-27 NOTE — Telephone Encounter (Signed)
PT called to reach patient regarding no show for appointment this morning. Patient cancelled earlier this week due to being sick. His mom answered and reports he is still feeling unwell and took Nyquil not long ago. She explains that he will be taking an at home Covid-19 test today, and she will contact us regarding his results to adjust appointments if he tests positive. She was informed of attendance policy and will plan to review attendance policy with patient during next PT session.  Rhea Bleacher, PT, DPT 09/27/20 11:07 AM

## 2020-10-02 ENCOUNTER — Other Ambulatory Visit: Payer: Self-pay

## 2020-10-02 ENCOUNTER — Ambulatory Visit: Payer: 59

## 2020-10-02 DIAGNOSIS — S72142S Displaced intertrochanteric fracture of left femur, sequela: Secondary | ICD-10-CM

## 2020-10-02 DIAGNOSIS — S82402D Unspecified fracture of shaft of left fibula, subsequent encounter for closed fracture with routine healing: Secondary | ICD-10-CM | POA: Diagnosis not present

## 2020-10-02 DIAGNOSIS — M79605 Pain in left leg: Secondary | ICD-10-CM

## 2020-10-02 DIAGNOSIS — M25662 Stiffness of left knee, not elsewhere classified: Secondary | ICD-10-CM

## 2020-10-02 DIAGNOSIS — M25672 Stiffness of left ankle, not elsewhere classified: Secondary | ICD-10-CM

## 2020-10-02 DIAGNOSIS — R262 Difficulty in walking, not elsewhere classified: Secondary | ICD-10-CM

## 2020-10-02 DIAGNOSIS — S82292S Other fracture of shaft of left tibia, sequela: Secondary | ICD-10-CM

## 2020-10-02 DIAGNOSIS — R2689 Other abnormalities of gait and mobility: Secondary | ICD-10-CM

## 2020-10-02 DIAGNOSIS — M6281 Muscle weakness (generalized): Secondary | ICD-10-CM

## 2020-10-02 NOTE — Therapy (Signed)
[place], [place], [address] Phone: [phone]   Fax:  [phone]  Physical Therapy Treatment  Patient Details  Name: Kyle Spencer MRN: [id number] Date of Birth: February 14, 1976 Referring Provider (PT): Vivien Rota   Encounter Date: 10/02/2020   PT End of Session - 10/02/20 0915    Visit Number 4    Number of Visits 13    Date for PT Re-Evaluation 10/28/20    Authorization Type [employer]    Progress Note Due on Visit 10    PT Start Time 0915    PT Stop Time 0953    PT Time Calculation (min) 38 min    Equipment Utilized During Treatment Other (comment)   crutches - arrived with both but used R axillary crutch only throughout session   Activity Tolerance Patient tolerated treatment well    Behavior During Therapy [facility] for tasks assessed/performed           Past Medical History:  Diagnosis Date  . Kidney stone     Past Surgical History:  Procedure Laterality Date  . EXTERNAL FIXATION LEG Left 06/15/2020   Procedure: IRRIGATION AND DEBRIDEMENT and  EXTERNAL FIXATION OF LEFT ANKLE;  Surgeon: Leandrew Koyanagi, MD;  Location: [place];  Service: Orthopedics;  Laterality: Left;  . I & D EXTREMITY Right 06/15/2020   Procedure: IRRIGATION AND DEBRIDEMENT RIGHT LOWER LEG;  Surgeon: Leandrew Koyanagi, MD;  Location: [place];  Service: Orthopedics;  Laterality: Right;  . INTRAMEDULLARY (IM) NAIL INTERTROCHANTERIC Left 06/16/2020   Procedure: INTRAMEDULLARY (IM) NAIL INTERTROCHANTRIC;  Surgeon: Shona Needles, MD;  Location: [place];  Service: Orthopedics;  Laterality: Left;  . KNEE SURGERY    . TIBIA IM NAIL INSERTION Left 06/16/2020   Procedure: INTRAMEDULLARY (IM) NAIL TIBIAL;  Surgeon: Shona Needles, MD;  Location: [place];  Service: Orthopedics;  Laterality: Left;    There were no vitals filed for this visit.   Subjective Assessment - 10/02/20 0916    Subjective "It's been good" when asked about  exercises. Pt reports he has been weaning himself off crutches. He states he has been using his crutches 1-2x/day with steps and longer distances.    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 No/denies    Pain Score 0-No pain    Pain Onset More than a month ago              [facility] PT Assessment - 10/02/20 0001      Assessment   Medical Diagnosis L femoral neck /subtrochanteric /intertrachanteric femur s/p IMN. fx L open tibia fx s/p IMN, L distal fibula fx    Referring Provider (PT) Delray Alt, PA-C    Onset Date/Surgical Date 06/15/20                         [facility] Adult PT Treatment/Exercise - 10/02/20 0001      Ambulation/Gait   Ambulation/Gait Yes    Gait Comments forward and backward gait training in parallel bars 2 x 10 feet each with focus on mechanics/sequence      Self-Care   Self-Care Other Self-Care Comments    Other Self-Care Comments  See patient education      Knee/Hip Exercises: Aerobic   Nustep L5 x 5 min LE only  Knee/Hip Exercises: Standing   Hip Flexion Limitations alternating marhces on airex x 30 (15x each LE) with BUE support at freemotion as needed    Forward Lunges Left;15 reps    Forward Lunges Limitations in parallel bars with BUE support    Hip Abduction Stengthening;Left;15 reps;Knee straight    Abduction Limitations without BUE support    Lateral Step Up Left;2 sets;10 reps;Step Height: 4"    Lateral Step Up Limitations LLE ascend    Forward Step Up Left;2 sets;10 reps;Step Height: 4"    Forward Step Up Limitations LLE ascend; RLE descend with cues for controlled motion    Functional Squat 20 reps    Functional Squat Limitations BUE support at freemotion for balance    Other Standing Knee Exercises standing toe raises while leaning against the wall x 10; cues for slight bilateral knee flexion      Ankle Exercises: Standing   SLS  L SLS 3 x 20 sec; pt experienced a brief "shock" through LLE during initial attempt that did not occur or last through remaining attempts    Heel Raises Both;20 reps      Ankle Exercises: Stretches   Soleus Stretch 1 rep;60 seconds    Gastroc Stretch 1 rep;Other (comment)   90 seconds                 PT Education - 10/02/20 1245    Education Details Reviewed HEP and advised to continue exercises and walking at home as able. Advised to bring his new [facility] to clinic when it comes in to make sure it is at an appropriate height - reviewed height being at wrist level ideally.    Person(s) Educated Patient    Methods Explanation;Demonstration;Verbal cues    Comprehension Verbalized understanding;Returned demonstration            PT Short Term Goals - 10/02/20 0920      PT SHORT TERM GOAL #1   Title Pt will be Ind in an initail HEP    Status Achieved    Target Date 09/30/20      PT SHORT TERM GOAL #2   Title Pt will progress to ambulation with a cane or single crutch s the cam boot    Baseline arrived without assistive device. uses crutches for steps, hills, prolonged distances and recently ordered new [employer] for occasional use as needed    Status Achieved    Target Date 09/30/20             PT Long Term Goals - 10/02/20 1256      PT LONG TERM GOAL #1   Title Pt's L knee AROM will increase to 0-125d and L ankle DF to 12d for improved functional mobility and gait    Baseline L knee FL 129 - will plan to reassess L knee EXT next session    Status Partially Met      PT LONG TERM GOAL #2   Title Increase L hip strength to 4+/5, knee to 5/5, and ankle to 5/5 for improved functional mobility and gait    Status Revised      PT LONG TERM GOAL #3   Title Pt's gait will improve to walking s an AD with no to mild limp over the L LE and tolerate walking 1081f for community and work ambulation    Status On-going      PT LONG TERM GOAL #4   Title Pt will be able to perform bending  and  lifting activities 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    Baseline 23.54 seconds    Status On-going      PT LONG TERM GOAL #6   Title Improved pt's FOTO score to the predicted value of 60% functional ability    Baseline 42% functional ability    Status On-going                 Plan - 10/02/20 0919    Clinical Impression Statement Patient arrives with no assistive device today. He is able to weightbear through LLE with minimal pain but demonstrates decreased swing phase with RLE to minimize left stance phase during gait. He has been ambulating without assistive device on level ground indoors but continues to use crutches or SPC when navigating stairs, walking prolonged distances, or walking up/down hills. Gait training in parallel bars today to focus on heel-to-toe pattern and initiated walking backwards for neuro re-education and to improve functional mobility. He experienced fatigue with LLE forward lunges, step ups, and toe raises but did not have complaints of increased pain. He should benefit from continued skilled PT to further progress LLE functional strengthening and gait training to allow for return to work.    Personal Factors and Comorbidities Fitness;Profession    Examination-Activity Limitations Locomotion Level;Transfers;Squat;Stairs;Stand;Lift;Carry;Reach Overhead    Examination-Participation Restrictions Occupation    Stability/Clinical Decision Making Stable/Uncomplicated    Rehab Potential Good    PT Frequency 2x / week    PT Duration 6 weeks    PT Treatment/Interventions ADLs/Self Care Home Management;Cryotherapy;Electrical Stimulation;Iontophoresis 32m/ml Dexamethasone;Moist Heat;Ultrasound;DME Instruction;Gait training;Stair training;Functional mobility training;Therapeutic  activities;Therapeutic exercise;Balance training;Manual techniques;Patient/family education;Passive range of motion;Dry needling;Taping;Vasopneumatic Device    PT Next Visit Plan Assess resonse to HEP. Progress ther ex including standing activities. Gait training. Goblet squats, hip hinge, dead lifts to progress towards work tasks of bending and lifting.    PT Home Exercise Plan [id number]    Consulted and Agree with Plan of Care Patient           Patient will benefit from skilled therapeutic intervention in order to improve the following deficits and impairments:  Abnormal gait,Decreased range of motion,Difficulty walking,Decreased activity tolerance,Pain,Decreased balance,Decreased strength  Visit Diagnosis: Closed fracture of shaft of left fibula with routine healing, unspecified fracture morphology, subsequent encounter  Intertrochanteric fracture of left femur, sequela  Other fracture of shaft of left tibia, sequela  Difficulty in walking, not elsewhere classified  Other abnormalities of gait and mobility  Decreased ROM of left knee  Decreased range of motion of left ankle  Pain in left leg  Muscle weakness (generalized)     Problem List Patient Active Problem List   Diagnosis Date Noted  . Subtrochanteric fracture of left femur ([place] 06/16/2020  . Fracture of femoral neck, left ([place] 06/16/2020  . Tibia/fibula fracture, left, open type I or II, initial encounter 06/15/2020  . Open displaced comminuted fracture of shaft of left tibia 06/15/2020     KHaydee Monica PT, DPT 10/02/20 1:03 PM  [facility] [address]Phone: [phone]  Fax:  [phone] Name: APATTERSON HOLLENBAUGHMRN: [id number] of Birth: 2Apr 06, 1977

## 2020-10-05 ENCOUNTER — Ambulatory Visit: Payer: 59

## 2020-10-05 ENCOUNTER — Other Ambulatory Visit: Payer: Self-pay

## 2020-10-05 DIAGNOSIS — M6281 Muscle weakness (generalized): Secondary | ICD-10-CM

## 2020-10-05 DIAGNOSIS — M79605 Pain in left leg: Secondary | ICD-10-CM

## 2020-10-05 DIAGNOSIS — M25662 Stiffness of left knee, not elsewhere classified: Secondary | ICD-10-CM

## 2020-10-05 DIAGNOSIS — M25672 Stiffness of left ankle, not elsewhere classified: Secondary | ICD-10-CM

## 2020-10-05 DIAGNOSIS — S72142S Displaced intertrochanteric fracture of left femur, sequela: Secondary | ICD-10-CM

## 2020-10-05 DIAGNOSIS — S82402D Unspecified fracture of shaft of left fibula, subsequent encounter for closed fracture with routine healing: Secondary | ICD-10-CM | POA: Diagnosis not present

## 2020-10-05 DIAGNOSIS — S82292S Other fracture of shaft of left tibia, sequela: Secondary | ICD-10-CM

## 2020-10-05 DIAGNOSIS — R262 Difficulty in walking, not elsewhere classified: Secondary | ICD-10-CM

## 2020-10-05 DIAGNOSIS — R2689 Other abnormalities of gait and mobility: Secondary | ICD-10-CM

## 2020-10-05 NOTE — Therapy (Signed)
[place], [place], [address] Phone: [phone]   Fax:  [phone]  Physical Therapy Treatment  Patient Details  Name: Kyle Spencer MRN: [id number] Date of Birth: May 26, 1976 Referring Provider (PT): Vivien Rota   Encounter Date: 10/05/2020   PT End of Session - 10/05/20 0957    Visit Number 5    Number of Visits 13    Date for PT Re-Evaluation 10/28/20    Authorization Type [employer]    Progress Note Due on Visit 10    PT Start Time 0921    PT Stop Time 1003    PT Time Calculation (min) 42 min    Equipment Utilized During Treatment Other (comment)   arrived s crutches, pt left in car   Activity Tolerance Patient tolerated treatment well    Behavior During Therapy [facility] for tasks assessed/performed           Past Medical History:  Diagnosis Date  . Kidney stone     Past Surgical History:  Procedure Laterality Date  . EXTERNAL FIXATION LEG Left 06/15/2020   Procedure: IRRIGATION AND DEBRIDEMENT and  EXTERNAL FIXATION OF LEFT ANKLE;  Surgeon: Leandrew Koyanagi, MD;  Location: [place];  Service: Orthopedics;  Laterality: Left;  . I & D EXTREMITY Right 06/15/2020   Procedure: IRRIGATION AND DEBRIDEMENT RIGHT LOWER LEG;  Surgeon: Leandrew Koyanagi, MD;  Location: [place];  Service: Orthopedics;  Laterality: Right;  . INTRAMEDULLARY (IM) NAIL INTERTROCHANTERIC Left 06/16/2020   Procedure: INTRAMEDULLARY (IM) NAIL INTERTROCHANTRIC;  Surgeon: Shona Needles, MD;  Location: [place];  Service: Orthopedics;  Laterality: Left;  . KNEE SURGERY    . TIBIA IM NAIL INSERTION Left 06/16/2020   Procedure: INTRAMEDULLARY (IM) NAIL TIBIAL;  Surgeon: Shona Needles, MD;  Location: [place];  Service: Orthopedics;  Laterality: Left;    There were no vitals filed for this visit.   Subjective Assessment - 10/05/20 0924    Subjective I'm doing alot better. I'm having more pain which seems related to th rainy weather.     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 Yes    Pain Score 6     Pain Location Leg    Pain Orientation Left    Pain Descriptors / Indicators Aching    Pain Type Chronic pain    Pain Onset More than a month ago    Pain Frequency Intermittent    Aggravating Factors  prolinged standing and walking    Pain Relieving Factors rest                             [facility] Adult PT Treatment/Exercise - 10/05/20 0001      Ambulation/Gait   Assistive device Straight cane    Gait Pattern Step-through pattern   antalgic gait pattern over the L LE s SPC.     Exercises   Exercises Knee/Hip;Ankle      Knee/Hip Exercises: Aerobic   Nustep L5 x 5 min LE only      Knee/Hip Exercises: Standing   Knee Flexion Left;2 sets;10 reps    Hip Flexion Limitations alternating marhces on airex x 30 (15x each LE) with BUE support at freemotion as needed    Hip Abduction Left;2 sets;10 reps;Knee straight    Abduction Limitations 3 lbs    Hip Extension Left;2  sets;10 reps;Knee straight    Extension Limitations 3 lbs    Functional Squat 20 reps    Functional Squat Limitations s BUE support      Knee/Hip Exercises: Seated   Long Arc Quad Left;2 sets;10 reps      Ankle Exercises: Standing   Heel Raises Both;20 reps    Heel Raises Limitations toe lifts               Balance Exercises - 10/05/20 0001      Balance Exercises: Standing   Tandem Stance Eyes open;Foam/compliant surface;1 rep;30 secs   each foot forward   SLS Eyes open;Foam/compliant surface;1 rep;30 secs   R and L. s hand A R. 1 hand A L.              PT Short Term Goals - 10/02/20 0920      PT SHORT TERM GOAL #1   Title Pt will be Ind in an initail HEP    Status Achieved    Target Date 09/30/20      PT SHORT TERM GOAL #2   Title Pt will progress to ambulation with a cane or single crutch s the cam boot    Baseline arrived without assistive device. uses  crutches for steps, hills, prolonged distances and recently ordered new [facility] for occasional use as needed    Status Achieved    Target Date 09/30/20             PT Long Term Goals - 10/02/20 1256      PT LONG TERM GOAL #1   Title Pt's L knee AROM will increase to 0-125d and L ankle DF to 12d for improved functional mobility and gait    Baseline L knee FL 129 - will plan to reassess L knee EXT next session    Status Partially Met      PT LONG TERM GOAL #2   Title Increase L hip strength to 4+/5, knee to 5/5, and ankle to 5/5 for improved functional mobility and gait    Status Revised      PT LONG TERM GOAL #3   Title Pt's gait will improve to walking s an AD with no to mild limp over the L LE and tolerate walking 1056f for community and work ambulation    Status On-going      PT LONG TERM GOAL #4   Title Pt will be able to perform bending and lifting 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    Baseline 23.54 seconds    Status On-going      PT LONG TERM GOAL #6   Title Improved pt's FOTO score to the predicted value of 60% functional ability    Baseline 42% functional ability    Status On-going                 Plan - 10/05/20 0956    Clinical Impression Statement Pt walked into the clinic s use of crutch today. Pt demonstrates an antalgic gait over the L LE. Pt reports he does not walk without the crutch often. With use of SPC, pt's gait pattern improved. Pt reports [facility]. PT contnued to address L LE  strength with open and closed chain exs. Pt tolerated today's session s adverse effects.    Personal Factors and Comorbidities Fitness;Profession    Examination-Activity Limitations Locomotion Level;Transfers;Squat;Stairs;Stand;Lift;Carry;Reach Overhead     Examination-Participation Restrictions Occupation    Stability/Clinical Decision Making Stable/Uncomplicated    Clinical Decision Making Low    Rehab Potential Good    PT Frequency 1x / week    PT Duration 6 weeks    PT Treatment/Interventions ADLs/Self Care Home Management;Cryotherapy;Electrical Stimulation;Iontophoresis 54m/ml Dexamethasone;Moist Heat;Ultrasound;DME Instruction;Gait training;Stair training;Functional mobility training;Therapeutic activities;Therapeutic exercise;Balance training;Manual techniques;Patient/family education;Passive range of motion;Dry needling;Taping;Vasopneumatic Device    PT Next Visit Plan Re-assess FOTO. Update HEP. Progress ther ex including standing activities. Gait training. Goblet squats, hip hinge, dead lifts to progress towards work tasks of bending and lifting.    PT Home Exercise Plan [id number]    Consulted and Agree with Plan of Care Patient           Patient will benefit from skilled therapeutic intervention in order to improve the following deficits and impairments:  Abnormal gait,Decreased range of motion,Difficulty walking,Decreased activity tolerance,Pain,Decreased balance,Decreased strength  Visit Diagnosis: Intertrochanteric fracture of left femur, sequela  Other fracture of shaft of left tibia, sequela  Difficulty in walking, not elsewhere classified  Other abnormalities of gait and mobility  Decreased ROM of left knee  Decreased range of motion of left ankle  Pain in left leg  Muscle weakness (generalized)     Problem List Patient Active Problem List   Diagnosis Date Noted  . [place] 06/16[place]  . Fract[place] of [place], left [place] 06/16/2020  . Tibia/fibula fracture, left, open type I or II, initial encounter 06/15/2020  . Open displaced comminuted fracture of shaft of left tibia 06/15/2020   AGar PontoMS, PT 10/05/20 12:58 PM  [place] [facility]Clar[place] [address]Phone: [phone]  Fax:  [phone] Name: Kyle SCHAABMRN: [id number] of Birth: 212/14/1977

## 2020-10-09 ENCOUNTER — Ambulatory Visit: Payer: 59

## 2020-10-12 ENCOUNTER — Ambulatory Visit: Payer: 59

## 2020-10-17 ENCOUNTER — Other Ambulatory Visit: Payer: Self-pay

## 2020-10-17 ENCOUNTER — Ambulatory Visit: Payer: 59 | Attending: Student

## 2020-10-17 DIAGNOSIS — M6281 Muscle weakness (generalized): Secondary | ICD-10-CM | POA: Insufficient documentation

## 2020-10-17 DIAGNOSIS — M25672 Stiffness of left ankle, not elsewhere classified: Secondary | ICD-10-CM

## 2020-10-17 DIAGNOSIS — R262 Difficulty in walking, not elsewhere classified: Secondary | ICD-10-CM | POA: Insufficient documentation

## 2020-10-17 DIAGNOSIS — S82402D Unspecified fracture of shaft of left fibula, subsequent encounter for closed fracture with routine healing: Secondary | ICD-10-CM | POA: Diagnosis present

## 2020-10-17 DIAGNOSIS — M79605 Pain in left leg: Secondary | ICD-10-CM | POA: Insufficient documentation

## 2020-10-17 DIAGNOSIS — S72142S Displaced intertrochanteric fracture of left femur, sequela: Secondary | ICD-10-CM | POA: Insufficient documentation

## 2020-10-17 DIAGNOSIS — M25662 Stiffness of left knee, not elsewhere classified: Secondary | ICD-10-CM

## 2020-10-17 DIAGNOSIS — R2689 Other abnormalities of gait and mobility: Secondary | ICD-10-CM | POA: Diagnosis present

## 2020-10-17 DIAGNOSIS — S82292S Other fracture of shaft of left tibia, sequela: Secondary | ICD-10-CM | POA: Diagnosis present

## 2020-10-17 NOTE — Therapy (Signed)
[place], [place], [address] Phone: [phone]   Fax:  [phone]  Physical Therapy Treatment  Patient Details  Name: Kyle Spencer MRN: [id number] Date of Birth: 06/20/1976 Referring Provider (PT): Vivien Rota   Encounter Date: 10/17/2020   PT End of Session - 10/17/20 1141    Visit Number 6    Number of Visits 13    Date for PT Re-Evaluation 10/28/20    Authorization Type [employer]    Progress Note Due on Visit 10    PT Start Time 1245    PT Stop Time 1220    PT Time Calculation (min) 45 min    Activity Tolerance Patient tolerated treatment well    Behavior During Therapy [facility] for tasks assessed/performed           Past Medical History:  Diagnosis Date  . Kidney stone     Past Surgical History:  Procedure Laterality Date  . EXTERNAL FIXATION LEG Left 06/15/2020   Procedure: IRRIGATION AND DEBRIDEMENT and  EXTERNAL FIXATION OF LEFT ANKLE;  Surgeon: Leandrew Koyanagi, MD;  Location: [place];  Service: Orthopedics;  Laterality: Left;  . I & D EXTREMITY Right 06/15/2020   Procedure: IRRIGATION AND DEBRIDEMENT RIGHT LOWER LEG;  Surgeon: Leandrew Koyanagi, MD;  Location: [place];  Service: Orthopedics;  Laterality: Right;  . INTRAMEDULLARY (IM) NAIL INTERTROCHANTERIC Left 06/16/2020   Procedure: INTRAMEDULLARY (IM) NAIL INTERTROCHANTRIC;  Surgeon: Shona Needles, MD;  Location: [place];  Service: Orthopedics;  Laterality: Left;  . KNEE SURGERY    . TIBIA IM NAIL INSERTION Left 06/16/2020   Procedure: INTRAMEDULLARY (IM) NAIL TIBIAL;  Surgeon: Shona Needles, MD;  Location: [place];  Service: Orthopedics;  Laterality: Left;    There were no vitals filed for this visit.   Subjective Assessment - 10/17/20 1142    Subjective Pt continues to report improvement of the L leg. Bothers him mostly with the first several steps in the AM.    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 No/denies    Pain Score 0-No pain   5/10 c initial steps in the AM.   Pain Location Ankle    Pain Orientation Left    Pain Descriptors / Indicators Aching    Pain Type Chronic pain    Pain Onset More than a month ago    Pain Frequency Intermittent                             [facility] Adult PT Treatment/Exercise - 10/17/20 0001      Exercises   Exercises Knee/Hip;Ankle      Knee/Hip Exercises: Aerobic   Nustep L6 x 5 min LE only      Knee/Hip Exercises: Machines for Strengthening   Cybex Leg Press 80#; 3x10      Knee/Hip Exercises: Standing   Lateral Step Up Left;2 sets;10 reps;Step Height: 4"    Lateral Step Up Limitations LLE ascend    Forward Step Up Left;2 sets;10 reps;Step Height: 4"    Forward Step Up Limitations LLE ascend; RLE descend with cues for controlled motion    Other Standing Knee Exercises Banded exs c green Tband; sid steps, forward and backward monster walks; 67fx2 each      Ankle Exercises: Stretches   Soleus Stretch 1 rep;30 seconds  Gastroc Stretch 1 rep;Other (comment);30 seconds   90 seconds     Ankle Exercises: Standing   SLS L SLS c vectors   30 sec, 3x   Heel Raises Both;20 reps   10x c L ankle c1 hand assist   Heel Raises Limitations toe lifts                    PT Short Term Goals - 10/17/20 1857      PT SHORT TERM GOAL #1   Title --             PT Long Term Goals - 10/02/20 1256      PT LONG TERM GOAL #1   Title Pt's L knee AROM will increase to 0-125d and L ankle DF to 12d for improved functional mobility and gait    Baseline L knee FL 129 - will plan to reassess L knee EXT next session    Status Partially Met      PT LONG TERM GOAL #2   Title Increase L hip strength to 4+/5, knee to 5/5, and ankle to 5/5 for improved functional mobility and gait    Status Revised      PT LONG TERM GOAL #3   Title Pt's gait will improve to walking s an AD with no to mild limp over the L LE  and tolerate walking 1014f for community and work ambulation    Status On-going      PT LONG TERM GOAL #4   Title Pt will be able to perform bending and lifting 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    Baseline 23.54 seconds    Status On-going      PT LONG TERM GOAL #6   Title Improved pt's FOTO score to the predicted value of 60% functional ability    Baseline 42% functional ability    Status On-going                 Plan - 10/17/20 1400    Clinical Impression Statement Pt focused on CKC LE strengthening, initiation of reciprocal pattern for asc/dsc steps, and L SLS balance. Pt presented to PT walking without a SPC. Pt's gait quality is improved c an mild limp over the L LE.    Personal Factors and Comorbidities Fitness;Profession    Examination-Activity Limitations Locomotion Level;Transfers;Squat;Stairs;Stand;Lift;Carry;Reach Overhead    Examination-Participation Restrictions Occupation    Stability/Clinical Decision Making Stable/Uncomplicated    Clinical Decision Making Low    Rehab Potential Good    PT Frequency 1x / week    PT Duration 6 weeks    PT Treatment/Interventions ADLs/Self Care Home Management;Cryotherapy;Electrical Stimulation;Iontophoresis 410mml Dexamethasone;Moist Heat;Ultrasound;DME Instruction;Gait training;Stair training;Functional mobility training;Therapeutic activities;Therapeutic exercise;Balance training;Manual techniques;Patient/family education;Passive range of motion;Dry needling;Taping;Vasopneumatic Device    PT Next Visit Plan Re-assess FOTO. Update HEP. Progress ther ex including standing activities. Gait training. Goblet squats, hip hinge, dead lifts to progress towards work tasks of bending and lifting.    PT Home Exercise Plan [id number]    Consulted and Agree  with Plan of Care Patient           Patient will benefit from skilled therapeutic intervention in order to improve the following deficits and impairments:  Abnormal gait,Decreased range  of motion,Difficulty walking,Decreased activity tolerance,Pain,Decreased balance,Decreased strength  Visit Diagnosis: Intertrochanteric fracture of left femur, sequela  Other fracture of shaft of left tibia, sequela  Difficulty in walking, not elsewhere classified  Other abnormalities of gait and mobility  Decreased ROM of left knee  Decreased range of motion of left ankle  Pain in left leg  Muscle weakness (generalized)  Closed fracture of shaft of left fibula with routine healing, unspecified fracture morphology, subsequent encounter     Problem List Patient Active Problem List   Diagnosis Date Noted  . Subtrochanteric fracture of left femur ([place]) 06/16/2020  . Fracture of femoral neck, left ([place]) 06/16/2020  . Tibia/fibula fracture, left, open type I or II, initial encounter 06/15/2020  . Open displaced comminuted fracture of shaft of left tibia 06/15/2020    Gar Ponto MS, PT 10/17/20 7:13 PM  [facility] [address] [place], [place], [address] Phone: [phone]   Fax:  [phone]  Name: Kyle Spencer MRN: [id number] Date of Birth: 12/27/1975

## 2020-10-19 ENCOUNTER — Ambulatory Visit: Payer: 59

## 2020-10-23 ENCOUNTER — Ambulatory Visit: Payer: 59 | Admitting: Physical Therapy

## 2020-10-23 ENCOUNTER — Encounter: Payer: Self-pay | Admitting: Physical Therapy

## 2020-10-23 ENCOUNTER — Other Ambulatory Visit: Payer: Self-pay

## 2020-10-23 DIAGNOSIS — R262 Difficulty in walking, not elsewhere classified: Secondary | ICD-10-CM

## 2020-10-23 DIAGNOSIS — R2689 Other abnormalities of gait and mobility: Secondary | ICD-10-CM

## 2020-10-23 DIAGNOSIS — S72142S Displaced intertrochanteric fracture of left femur, sequela: Secondary | ICD-10-CM | POA: Diagnosis not present

## 2020-10-23 DIAGNOSIS — S82292S Other fracture of shaft of left tibia, sequela: Secondary | ICD-10-CM

## 2020-10-23 NOTE — Therapy (Signed)
[place], [place], [address] Phone: [phone]   Fax:  [phone]  Physical Therapy Treatment  Patient Details  Name: Kyle Spencer MRN: [id number] Date of Birth: [birth year] Referring Provider (PT): Vivien Rota   Encounter Date: 10/23/2020   PT End of Session - 10/23/20 1532    Visit Number 7    Number of Visits 13    Date for PT Re-Evaluation 10/28/20    Authorization Type [employer]    Progress Note Due on Visit 10    PT Start Time 1538    PT Stop Time 1612    PT Time Calculation (min) 34 min    Activity Tolerance Patient tolerated treatment well    Behavior During Therapy [facility] for tasks assessed/performed           Past Medical History:  Diagnosis Date  . Kidney stone     Past Surgical History:  Procedure Laterality Date  . EXTERNAL FIXATION LEG Left 06/15/2020   Procedure: IRRIGATION AND DEBRIDEMENT and  EXTERNAL FIXATION OF LEFT ANKLE;  Surgeon: Leandrew Koyanagi, MD;  Location: [place];  Service: Orthopedics;  Laterality: Left;  . I & D EXTREMITY Right 06/15/2020   Procedure: IRRIGATION AND DEBRIDEMENT RIGHT LOWER LEG;  Surgeon: Leandrew Koyanagi, MD;  Location: [place];  Service: Orthopedics;  Laterality: Right;  . INTRAMEDULLARY (IM) NAIL INTERTROCHANTERIC Left 06/16/2020   Procedure: INTRAMEDULLARY (IM) NAIL INTERTROCHANTRIC;  Surgeon: Shona Needles, MD;  Location: [place];  Service: Orthopedics;  Laterality: Left;  . KNEE SURGERY    . TIBIA IM NAIL INSERTION Left 06/16/2020   Procedure: INTRAMEDULLARY (IM) NAIL TIBIAL;  Surgeon: Shona Needles, MD;  Location: [place];  Service: Orthopedics;  Laterality: Left;    There were no vitals filed for this visit.   Subjective Assessment - 10/23/20 1535    Subjective " I see the Doctor tomorrow. 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 No/denies    Pain Score 7     Pain Location Hip    Pain Orientation Left    Pain Descriptors / Indicators Aching;Sore    Pain Type Chronic pain    Pain Onset More than a month ago    Aggravating Factors  standing / walking              [facility] PT Assessment - 10/23/20 0001      Assessment   Medical Diagnosis L femoral neck /subtrochanteric /intertrachanteric femur s/p IMN. fx L open tibia fx s/p IMN, L distal fibula fx    Referring Provider (PT) Delray Alt, PA-C    Onset Date/Surgical Date 06/15/20                         [facility] Adult PT Treatment/Exercise - 10/23/20 0001      Knee/Hip Exercises: Stretches   Hip Flexor Stretch 2 reps;30 seconds;Left      Knee/Hip Exercises: Aerobic   Nustep L3 x 3 min , L 5 x 3 min LE only      Knee/Hip Exercises: Standing   Lateral Step Up --    Step Down --      Knee/Hip Exercises: Seated   Long Arc Quad 2 sets;15 reps  Long Arc Quad Weight 3 lbs.    Marching 1 set;15 reps    Marching Weights --    Sit to [employer] --      Modalities   Modalities Cryotherapy      Cryotherapy   Number Minutes Cryotherapy 10 Minutes    Cryotherapy Location Hip    Type of Cryotherapy Ice pack   in a semi-hip flexor stretch position     Manual Therapy   Manual therapy comments STW along the hip flexor on the L                  PT Education - 10/23/20 1610    Education Details reviewed icing frequency and stretching, to take it easy until he sees the MD tomorrow.    Person(s) Educated Patient    Methods Explanation    Comprehension Verbalized understanding            PT Short Term Goals - 10/17/20 1857      PT SHORT TERM GOAL #1   Title --             PT Long Term Goals - 10/02/20 1256      PT LONG TERM GOAL #1   Title Pt's L knee AROM will increase to 0-125d and L ankle DF to 12d for improved functional mobility and gait    Baseline L knee FL 129 - will plan to  reassess L knee EXT next session    Status Partially Met      PT LONG TERM GOAL #2   Title Increase L hip strength to 4+/5, knee to 5/5, and ankle to 5/5 for improved functional mobility and gait    Status Revised      PT LONG TERM GOAL #3   Title Pt's gait will improve to walking s an AD with no to mild limp over the L LE and tolerate walking 1017f for community and work ambulation    Status On-going      PT LONG TERM GOAL #4   Title Pt will be able to perform bending and lifting 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    Baseline 23.54 seconds    Status On-going      PT LONG TERM GOAL #6   Title Improved pt's FOTO score to the predicted value of 60% functional ability    Baseline 42% functional ability    Status On-going                 Plan - 10/23/20 1544    Clinical Impression Statement pt arrives to PT today reporting L proximal hip pain that ocurred when he was getting out of the shower and slipped/ caught himself noting immediate pain. pt reports pain at 6-7/10 and notes pain is worse with walking/ standingBased on cyriax findings he presents as a potential muscel strain. based on pain and irritability opted to hold off of exercies today and stretch. He sees his MD tomorrow and may benefit from reimaging.    Examination-Activity Limitations Locomotion Level;Transfers;Squat;Stairs;Stand;Lift;Carry;Reach Overhead    PT Treatment/Interventions ADLs/Self Care Home Management;Cryotherapy;Electrical Stimulation;Iontophoresis 471mml Dexamethasone;Moist Heat;Ultrasound;DME Instruction;Gait training;Stair training;Functional mobility training;Therapeutic activities;Therapeutic exercise;Balance training;Manual techniques;Patient/family education;Passive range of motion;Dry needling;Taping;Vasopneumatic  Device    PT Next Visit  Plan Re-assess FOTO. Update HEP. Progress ther ex including standing activities. Gait training. Goblet squats, hip hinge, dead lifts to progress towards work tasks of bending and lifting.    PT Home Exercise Plan [id number]    Consulted and Agree with Plan of Care Patient           Patient will benefit from skilled therapeutic intervention in order to improve the following deficits and impairments:  Abnormal gait,Decreased range of motion,Difficulty walking,Decreased activity tolerance,Pain,Decreased balance,Decreased strength  Visit Diagnosis: Intertrochanteric fracture of left femur, sequela  Other fracture of shaft of left tibia, sequela  Difficulty in walking, not elsewhere classified  Other abnormalities of gait and mobility     Problem List Patient Active Problem List   Diagnosis Date Noted  . Subtrochanteric fracture of left femur ([place]) 06/16/2020  . Fracture of femoral neck, left ([place]) 06/16/2020  . Tibia/fibula fracture, left, open type I or II, initial encounter 06/15/2020  . Open displaced comminuted fracture of shaft of left tibia 06/15/2020   Starr [place] PT, DPT, LAT, ATC  10/23/20  4:16 PM      [facility] [address] [place], [place], [address] Phone: [phone]   Fax:  [phone]  Name: Kyle Spencer MRN: [id number] Date of Birth: [birth year]

## 2020-11-07 ENCOUNTER — Ambulatory Visit: Payer: 59

## 2020-11-15 ENCOUNTER — Ambulatory Visit: Payer: 59 | Attending: Student

## 2020-11-15 ENCOUNTER — Other Ambulatory Visit: Payer: Self-pay

## 2020-11-15 DIAGNOSIS — M6281 Muscle weakness (generalized): Secondary | ICD-10-CM | POA: Diagnosis present

## 2020-11-15 DIAGNOSIS — S72142S Displaced intertrochanteric fracture of left femur, sequela: Secondary | ICD-10-CM | POA: Diagnosis present

## 2020-11-15 DIAGNOSIS — M25662 Stiffness of left knee, not elsewhere classified: Secondary | ICD-10-CM | POA: Insufficient documentation

## 2020-11-15 DIAGNOSIS — S82292S Other fracture of shaft of left tibia, sequela: Secondary | ICD-10-CM | POA: Diagnosis present

## 2020-11-15 DIAGNOSIS — R2689 Other abnormalities of gait and mobility: Secondary | ICD-10-CM | POA: Insufficient documentation

## 2020-11-15 DIAGNOSIS — M79605 Pain in left leg: Secondary | ICD-10-CM | POA: Diagnosis present

## 2020-11-15 DIAGNOSIS — M25672 Stiffness of left ankle, not elsewhere classified: Secondary | ICD-10-CM | POA: Insufficient documentation

## 2020-11-15 DIAGNOSIS — R262 Difficulty in walking, not elsewhere classified: Secondary | ICD-10-CM | POA: Diagnosis present

## 2020-11-15 DIAGNOSIS — S82402D Unspecified fracture of shaft of left fibula, subsequent encounter for closed fracture with routine healing: Secondary | ICD-10-CM | POA: Diagnosis present

## 2020-11-15 NOTE — Patient Instructions (Signed)
Squats, forward and lateral steps added to HEP; plus weight as tolerated for 3 way SLRs

## 2020-11-15 NOTE — Therapy (Signed)
[facility] Outpatient Rehabilitation [facility] [address] [place], [place], [address] Phone: [phone]   Fax:  [phone]  Physical Therapy Treatment  Patient Details  Name: YOSSEF GILKISON MRN: [id number] Date of Birth: 1976/01/03 Referring Provider (PT): Bebe Liter   Encounter Date: 11/15/2020   PT End of Session - 11/15/20 1231    Visit Number 8    Number of Visits 16    Date for PT Re-Evaluation 10/28/20    Authorization Type [employer]    Progress Note Due on Visit 10    PT Start Time 1218    PT Stop Time 1305    PT Time Calculation (min) 47 min    Activity Tolerance Patient tolerated treatment well    Behavior During Therapy [facility] for tasks assessed/performed           Past Medical History:  Diagnosis Date  . Kidney stone     Past Surgical History:  Procedure Laterality Date  . EXTERNAL FIXATION LEG Left 06/15/2020   Procedure: IRRIGATION AND DEBRIDEMENT and  EXTERNAL FIXATION OF LEFT ANKLE;  Surgeon: Tarry Kos, MD;  Location: [facility] OR;  Service: Orthopedics;  Laterality: Left;  . I & D EXTREMITY Right 06/15/2020   Procedure: IRRIGATION AND DEBRIDEMENT RIGHT LOWER LEG;  Surgeon: Tarry Kos, MD;  [facility]: [facility] OR;  Service: Orthopedics;  Laterality: Right;  . INTRAMEDULLARY (IM) NAIL INTERTROCHANTERIC Left 06/16/2020   Procedure: INTRAMEDULLARY (IM) NAIL INTERTROCHANTRIC;  Surgeon: Roby Lofts, MD;  [facility] OR;  Service: Orthopedics;  Laterality: Left;  . KNEE SURGERY    . TIBIA IM NAIL INSERTION Left 06/16/2020   Procedure: INTRAMEDULLARY (IM) NAIL TIBIAL;  Surgeon: Roby Lofts, MD;  [facility] OR;  Service: Orthopedics;  Laterality: Left;    There were no vitals filed for this visit.   Subjective Assessment - 11/15/20 1223    Subjective Pt reports no issues after the slip in the shower. He sees Dr. Lorra Hals on 5/15. Pt reports being stiff and slow in the AM. Gradually increases wt. bearing as he warms up.     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 No/denies    Pain Score 0-No pain    Pain Location Hip    Pain Orientation Left              [facility] PT Assessment - 11/15/20 0001      AROM   Left Knee Extension 0      Strength   Left Hip Flexion 4/5    Left Hip Extension 4-/5    Left Hip External Rotation 4-/5    Left Hip Internal Rotation 4-/5    Left Hip ABduction 4/5    Left Hip ADduction 4/5    Left Knee Flexion 4+/5    Left Knee Extension 4+/5    Left Ankle Dorsiflexion 5/5    Left Ankle Plantar Flexion 5/5    Left Ankle Inversion 5/5    Left Ankle Eversion 4+/5      Ambulation/Gait   Gait Comments 6 MWT= 1059 ft Min limp over the L LE associated with weakness not pain,      Balance   Balance Assessed Yes      Standardized Balance Assessment   Standardized Balance Assessment Timed Up and Go Test      Timed Up and Go Test   Normal TUG (seconds) 8.5   2 tests  8.8 and 8.2=8.5 sec                        [facility] Adult PT Treatment/Exercise - 11/15/20 0001      Exercises   Exercises Knee/Hip;Ankle      Knee/Hip Exercises: Standing   Lateral Step Up Left;2 sets;10 reps;Step Height: 4"    Lateral Step Up Limitations LLE ascend    Forward Step Up Left;2 sets;10 reps;Step Height: 4"    Forward Step Up Limitations LLE ascend; RLE descend with cues for controlled motion    Functional Squat 2 sets;10 reps    Functional Squat Limitations goblet squat      Knee/Hip Exercises: Supine   Straight Leg Raises Left;2 sets;10 reps    Straight Leg Raises Limitations 5 lbs      Knee/Hip Exercises: Sidelying   Hip ABduction Left;2 sets;10 reps    Hip ABduction Limitations 5 lbs      Knee/Hip Exercises: Prone   Straight Leg Raises Left;2 sets;10 reps    Straight Leg Raises Limitations 5 lbs                  PT Education - 11/15/20 1329    Education Details Updated HEP    Person(s) Educated Patient     Methods Explanation;Demonstration;Tactile cues;Verbal cues    Comprehension Verbalized understanding;Returned demonstration;Verbal cues required;Tactile cues required            PT Short Term Goals - 10/17/20 1857      PT SHORT TERM GOAL #1   Title --             PT Long Term Goals - 11/15/20 1347      PT LONG TERM GOAL #1   Title Pt's L knee AROM will increase to 0-125d and L ankle DF to 12d for improved functional mobility and gait. 11/15/20+ L knee ext=0d    Baseline L knee FL 129 - will plan to reassess L knee EXT next session    Status Achieved    Target Date 11/15/20      PT LONG TERM GOAL #2   Title Increase L hip strength to 4+/5, knee to 5/5, and ankle to 5/5 for improved functional mobility and gait. 11/15/20: improving but not to goal    Baseline see flowsheets    Status On-going    Target Date 01/06/21      PT LONG TERM GOAL #3   Title Pt's gait will improve to walking s an AD with no to mild limp over the L LE and tolerate walking 1059ft for community and work ambulation. 54//22: 6MWT=1059 c min limp L LE. 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 11/15/20: 8.5sec. Rvised goal to 7.5 sec or less    Status Revised    Target Date 01/06/21      PT LONG TERM GOAL #6   Title Improved pt's FOTO score to the predicted value of 60% functional ability  Baseline 42% functional ability    Status On-going    Target Date 01/06/21                 Plan - 11/15/20 1330    Clinical Impression Statement PT was completed for LE strenthening in both the open and CKC, HEP progression, and gait  training. Pt is making appropriate progress (see goals), but continues to have L LE strength and mobility deficits. 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 Pt will benefit from continued PT 1w8 for L LE strengthening, to improve gait quality, and for functional activities for pt to meet the demands of his job and so he can return back to his profession.    Personal Factors and Comorbidities Fitness;Profession    Examination-Activity Limitations Locomotion Level;Transfers;Squat;Stairs;Stand;Lift;Carry;Reach Overhead    Examination-Participation Restrictions Occupation    Stability/Clinical Decision Making Stable/Uncomplicated    Clinical Decision Making Low    Rehab Potential Good    PT Frequency 1x / week    PT Duration 8 weeks    PT Treatment/Interventions ADLs/Self Care Home Management;Cryotherapy;Electrical Stimulation;Iontophoresis 4mg /ml Dexamethasone;Moist Heat;Ultrasound;DME Instruction;Gait training;Stair training;Functional mobility training;Therapeutic activities;Therapeutic exercise;Balance training;Manual techniques;Patient/family education;Passive range of motion;Dry needling;Taping;Vasopneumatic Device    PT Next Visit Plan Re-assess FOTO. Update HEP. Progress ther ex including standing activities. Gait training. Goblet squats, hip hinge, dead lifts to progress towards work tasks of bending and lifting.    PT Home Exercise Plan [id number], forward and lateral steps added to HEP; plus weight as tolerated for 3 way SLRs    Consulted and Agree with Plan of Care Patient           Patient will benefit from skilled therapeutic intervention in order to improve the following deficits and impairments:  Abnormal gait,Decreased range of motion,Difficulty walking,Decreased activity tolerance,Pain,Decreased balance,Decreased  strength  Visit Diagnosis: Intertrochanteric fracture of left femur, sequela  Other fracture of shaft of left tibia, sequela  Difficulty in walking, not elsewhere classified  Other abnormalities of gait and mobility  Decreased ROM of left knee  Decreased range of motion of left ankle  Pain in left leg  Closed fracture of shaft of left fibula with routine healing, unspecified fracture morphology, subsequent encounter  Muscle weakness (generalized)     Problem List Patient Active Problem List   Diagnosis Date Noted  . Subtrochanteric fracture of left femur (HCC) 06/16/2020  . Fracture of femoral neck, left (HCC) 06/16/2020  . Tibia/fibula fracture, left, open type I or II, initial encounter 06/15/2020  . Open displaced comminuted fracture of shaft of left tibia 06/15/2020    14/08/2019 MS, PT 11/15/20 2:06 PM  [facility] Outpatient Rehabilitation [facility] [address] [place], [place], [place] Phone: [phone]   Fax:  [phone]  Name: BEATRICE SEHGAL MRN: Cherlyn Roberts Date of Birth: [birth year]

## 2020-11-22 ENCOUNTER — Ambulatory Visit: Payer: 59

## 2020-11-29 ENCOUNTER — Ambulatory Visit: Payer: 59 | Admitting: Physical Therapy

## 2020-11-29 ENCOUNTER — Other Ambulatory Visit: Payer: Self-pay

## 2020-11-29 ENCOUNTER — Encounter: Payer: Self-pay | Admitting: Physical Therapy

## 2020-11-29 DIAGNOSIS — M25672 Stiffness of left ankle, not elsewhere classified: Secondary | ICD-10-CM

## 2020-11-29 DIAGNOSIS — M25662 Stiffness of left knee, not elsewhere classified: Secondary | ICD-10-CM

## 2020-11-29 DIAGNOSIS — S72142S Displaced intertrochanteric fracture of left femur, sequela: Secondary | ICD-10-CM | POA: Diagnosis not present

## 2020-11-29 DIAGNOSIS — S82292S Other fracture of shaft of left tibia, sequela: Secondary | ICD-10-CM

## 2020-11-29 DIAGNOSIS — R262 Difficulty in walking, not elsewhere classified: Secondary | ICD-10-CM

## 2020-11-29 DIAGNOSIS — R2689 Other abnormalities of gait and mobility: Secondary | ICD-10-CM

## 2020-11-29 NOTE — Therapy (Signed)
[facility] Outpatient Rehabilitation [facility] [address] [place], [place], [address] Phone: [phone]   Fax:  [phone]  Physical Therapy Treatment  Patient Details  Name: Kyle Spencer MRN: [id number] Date of Birth: 1975-08-12 Referring Provider (PT): Bebe Liter   Encounter Date: 11/29/2020    Past Medical History:  Diagnosis Date  . Kidney stone     Past Surgical History:  Procedure Laterality Date  . EXTERNAL FIXATION LEG Left 06/15/2020   Procedure: IRRIGATION AND DEBRIDEMENT and  EXTERNAL FIXATION OF LEFT ANKLE;  Surgeon: Tarry Kos, MD;  [facility];  Service: Orthopedics;  Laterality: Left;  . I & D EXTREMITY Right 06/15/2020   Procedure: IRRIGATION AND DEBRIDEMENT RIGHT LOWER LEG;  Surgeon: Tarry Kos, MD;  [facility];  Service: Orthopedics;  Laterality: Right;  . INTRAMEDULLARY (IM) NAIL INTERTROCHANTERIC Left 06/16/2020   Procedure: INTRAMEDULLARY (IM) NAIL INTERTROCHANTRIC;  Surgeon: Roby Lofts, MD;  [facility]: [facility];  Service: Orthopedics;  Laterality: Left;  . KNEE SURGERY    . TIBIA IM NAIL INSERTION Left 06/16/2020   Procedure: INTRAMEDULLARY (IM) NAIL TIBIAL;  Surgeon: Roby Lofts, MD;  [facility];  Service: Orthopedics;  Laterality: Left;    There were no vitals filed for this visit.   Subjective Assessment - 11/29/20 1216    Subjective Pt reports that things have been going well.  He has been working on steps at home.  He feels some minor improvement when dealing with steps.    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 Yes    Pain Score 0-No pain                             [facility] Adult PT Treatment/Exercise - 11/29/20 0001      High Level Balance   High Level Balance Comments tandem stance rebounder throw (L in rear 3x20), on Airex 20x      Knee/Hip Exercises: Aerobic   Nustep L6 x 5 min      Knee/Hip Exercises: Standing    Lateral Step Up Left;2 sets;10 reps;Step Height: 4"    Lateral Step Up Limitations LLE ascend    Forward Step Up Left;2 sets;10 reps;Step Height: 4"    Forward Step Up Limitations LLE ascend; RLE descend with cues for controlled motion    Functional Squat 10 reps;3 sets    Functional Squat Limitations assisted squat (UE support)      Knee/Hip Exercises: Seated   Long Arc Quad 3 sets;10 reps    Long Arc Quad Weight 8 lbs.      Knee/Hip Exercises: Supine   Straight Leg Raises Left;10 reps;3 sets    Straight Leg Raises Limitations 5 lbs      Knee/Hip Exercises: Sidelying   Hip ABduction Left;10 reps;3 sets    Hip ABduction Limitations 5 lbs      Knee/Hip Exercises: Prone   Straight Leg Raises Left;10 reps;3 sets    Straight Leg Raises Limitations 5 lbs                    PT Short Term Goals - 10/17/20 1857      PT SHORT TERM GOAL #1   Title --             PT Long Term Goals - 11/15/20 1347      PT  LONG TERM GOAL #1   Title Pt's L knee AROM will increase to 0-125d and L ankle DF to 12d for improved functional mobility and gait. 11/15/20+ L knee ext=0d    Baseline L knee FL 129 - will plan to reassess L knee EXT next session    Status Achieved    Target Date 11/15/20      PT LONG TERM GOAL #2   Title Increase L hip strength to 4+/5, knee to 5/5, and ankle to 5/5 for improved functional mobility and gait. 11/15/20: improving but not to goal    Baseline see flowsheets    Status On-going    Target Date 01/06/21      PT LONG TERM GOAL #3   Title Pt's gait will improve to walking s an AD with no to mild limp over the L LE and tolerate walking 1030ft for community and work ambulation. 54//22: 6MWT=1059 c min limp L LE. 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 11/15/20: 8.5sec. Rvised goal to 7.5 sec or less    Status Revised    Target Date 01/06/21      PT LONG TERM GOAL #6   Title Improved pt's FOTO score to the predicted value of 60% functional ability    Baseline 42% functional ability    Status On-going    Target Date 01/06/21                 Plan - 11/29/20 1230    Clinical Impression Statement Pt is progressing well with therapy.  Knee ext ROM to neutral.  We were able to progress SLR (ext, flexion, abduction) with an increased set.  Pt shows significant fatigue, particularly with SLR, but is able to complete therex with good form.  Pt does well with eccentric quad control during step work.  Added in proprioceptive work today with CGA rebounder throws.    Personal Factors and Comorbidities Fitness;Profession    Examination-Activity Limitations Locomotion Level;Transfers;Squat;Stairs;Stand;Lift;Carry;Reach Overhead    Examination-Participation Restrictions Occupation    Stability/Clinical Decision Making Stable/Uncomplicated    Rehab Potential Good    PT Frequency 1x / week    PT Duration 8 weeks    PT Treatment/Interventions ADLs/Self Care Home Management;Cryotherapy;Electrical Stimulation;Iontophoresis 4mg /ml Dexamethasone;Moist Heat;Ultrasound;DME Instruction;Gait training;Stair training;Functional mobility training;Therapeutic activities;Therapeutic exercise;Balance training;Manual techniques;Patient/family education;Passive range of motion;Dry needling;Taping;Vasopneumatic Device    PT Next Visit Plan Re-assess FOTO. Update HEP. Progress ther ex including standing activities. Gait training. Goblet squats, hip hinge, dead lifts to progress towards work tasks of bending and lifting.    PT Home Exercise Plan [id number]-Squats, forward and lateral steps added to  HEP; plus weight as tolerated for 3 way SLRs    Consulted and Agree with Plan of Care Patient           Patient will benefit from skilled therapeutic intervention in order to improve the following deficits and impairments:  Abnormal gait,Decreased range of motion,Difficulty walking,Decreased activity tolerance,Pain,Decreased balance,Decreased strength  Visit Diagnosis: Intertrochanteric fracture  of left femur, sequela  Other fracture of shaft of left tibia, sequela  Difficulty in walking, not elsewhere classified  Other abnormalities of gait and mobility  Decreased ROM of left knee  Decreased range of motion of left ankle     Problem List Patient Active Problem List   Diagnosis Date Noted  . Subtrochanteric fracture of left femur (HCC) 06/16/2020  . Fracture of femoral neck, left (HCC) 06/16/2020  . Tibia/fibula fracture, left, open type I or II, initial encounter 06/15/2020  . Open displaced comminuted fracture of shaft of left tibia 06/15/2020    Fredderick Phenix 11/29/2020, 1:06 PM  [facility] [address] [place], [place], [address] Phone: [phone]   Fax:  [phone]  Name: DORIAN DUVAL MRN: [id number] Date of Birth: August 16, 1975

## 2020-12-06 ENCOUNTER — Ambulatory Visit: Payer: 59 | Admitting: Physical Therapy

## 2020-12-13 ENCOUNTER — Ambulatory Visit: Payer: 59

## 2020-12-20 ENCOUNTER — Other Ambulatory Visit: Payer: Self-pay

## 2020-12-20 ENCOUNTER — Ambulatory Visit: Payer: 59 | Attending: Student | Admitting: Physical Therapy

## 2020-12-20 ENCOUNTER — Encounter: Payer: Self-pay | Admitting: Physical Therapy

## 2020-12-20 DIAGNOSIS — M6281 Muscle weakness (generalized): Secondary | ICD-10-CM | POA: Insufficient documentation

## 2020-12-20 DIAGNOSIS — M25672 Stiffness of left ankle, not elsewhere classified: Secondary | ICD-10-CM | POA: Insufficient documentation

## 2020-12-20 DIAGNOSIS — M79605 Pain in left leg: Secondary | ICD-10-CM | POA: Insufficient documentation

## 2020-12-20 DIAGNOSIS — R2689 Other abnormalities of gait and mobility: Secondary | ICD-10-CM | POA: Insufficient documentation

## 2020-12-20 DIAGNOSIS — S82402D Unspecified fracture of shaft of left fibula, subsequent encounter for closed fracture with routine healing: Secondary | ICD-10-CM | POA: Insufficient documentation

## 2020-12-20 DIAGNOSIS — M25662 Stiffness of left knee, not elsewhere classified: Secondary | ICD-10-CM | POA: Insufficient documentation

## 2020-12-20 DIAGNOSIS — S72142S Displaced intertrochanteric fracture of left femur, sequela: Secondary | ICD-10-CM | POA: Diagnosis present

## 2020-12-20 DIAGNOSIS — S82292S Other fracture of shaft of left tibia, sequela: Secondary | ICD-10-CM | POA: Diagnosis present

## 2020-12-20 DIAGNOSIS — R262 Difficulty in walking, not elsewhere classified: Secondary | ICD-10-CM | POA: Insufficient documentation

## 2020-12-20 NOTE — Therapy (Signed)
[place], [place], [address] Phone: [phone]   Fax:  [phone]  Physical Therapy Treatment  Patient Details  Name: Kyle Spencer MRN: [id number] Date of Birth: 03-07-1976 Referring Provider (PT): Vivien Rota   Encounter Date: 12/20/2020   PT End of Session - 12/20/20 1216    Visit Number 10    Number of Visits 16    Date for PT Re-Evaluation 01/06/21    Authorization Type [employer]    Progress Note Due on Visit 10    PT Start Time 1215    PT Stop Time 0100    PT Time Calculation (min) 765 min    Activity Tolerance Patient tolerated treatment well    Behavior During Therapy [facility] for tasks assessed/performed           Past Medical History:  Diagnosis Date  . Kidney stone     Past Surgical History:  Procedure Laterality Date  . EXTERNAL FIXATION LEG Left 06/15/2020   Procedure: IRRIGATION AND DEBRIDEMENT and  EXTERNAL FIXATION OF LEFT ANKLE;  Surgeon: Leandrew Koyanagi, MD;  Location: [place];  Service: Orthopedics;  Laterality: Left;  . I & D EXTREMITY Right 06/15/2020   Procedure: IRRIGATION AND DEBRIDEMENT RIGHT LOWER LEG;  Surgeon: Leandrew Koyanagi, MD;  Location: [place];  Service: Orthopedics;  Laterality: Right;  . INTRAMEDULLARY (IM) NAIL INTERTROCHANTERIC Left 06/16/2020   Procedure: INTRAMEDULLARY (IM) NAIL INTERTROCHANTRIC;  Surgeon: Shona Needles, MD;  Location: [place];  Service: Orthopedics;  Laterality: Left;  . KNEE SURGERY    . TIBIA IM NAIL INSERTION Left 06/16/2020   Procedure: INTRAMEDULLARY (IM) NAIL TIBIAL;  Surgeon: Shona Needles, MD;  Location: [place];  Service: Orthopedics;  Laterality: Left;    There were no vitals filed for this visit.   Subjective Assessment - 12/20/20 1220    Subjective Pt reports that things have been going well.  he has been going up and down his steps at home which he feels is a good workout.  He feels like thing are improving overall.  He  feels that it is easier for him to walk and navigate steps now.  He wants to "get the strength back" in his leg.    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 No/denies              [facility] PT Assessment - 12/20/20 0001      Observation/Other Assessments   Focus on Therapeutic Outcomes (FOTO)  52      Strength   Left Hip Flexion 4/5    Left Hip Extension 4-/5    Left Hip ABduction 4/5    Left Knee Flexion 4+/5    Left Knee Extension 4+/5    Left Ankle Dorsiflexion 5/5    Left Ankle Plantar Flexion 5/5    Left Ankle Inversion 5/5    Left Ankle Eversion 4+/5      High Level Balance   High Level Balance Comments --      Timed Up and Go Test   Normal TUG (seconds) 8                         [facility] Adult PT Treatment/Exercise - 12/20/20 0001      Therapeutic Activites    Therapeutic Activities Other Therapeutic Activities    Other Therapeutic Activities collececting  information for goal assessment, discussing POC and remaining deficits.      Knee/Hip Exercises: Aerobic   Nustep L6 x 5 min      Knee/Hip Exercises: Machines for Strengthening   Cybex Leg Press 1x10x @ 25#, 35#, 45#    Hip Cybex abduction: 2x10 ea @ 25#      Knee/Hip Exercises: Standing   Lateral Step Up Left;2 sets;10 reps;Step Height: 4"    Lateral Step Up Limitations LLE ascend    Forward Step Up Left;2 sets;10 reps;Step Height: 4"    Forward Step Up Limitations LLE ascend; LLE descend with cues for controlled motion with march                    PT Short Term Goals - 12/20/20 1222      PT SHORT TERM GOAL #1   Title Pt will be Ind in an initail HEP    Status Achieved    Target Date 09/30/20      PT SHORT TERM GOAL #2   Title Pt will progress to ambulation with a cane or single crutch s the cam boot    Baseline arrived without assistive device. uses crutches for steps, hills, prolonged distances and recently ordered new [facility] for  occasional use as needed    Status Achieved    Target Date 09/30/20             PT Long Term Goals - 12/20/20 1231      PT LONG TERM GOAL #1   Title Pt's L knee AROM will increase to 0-125d and L ankle DF to 12d for improved functional mobility and gait. 11/15/20+ L knee ext=0d    Baseline L knee FL 129 - will plan to reassess L knee EXT next session    Status Achieved      PT LONG TERM GOAL #2   Title Increase L hip strength to 4+/5, knee to 5/5, and ankle to 5/5 for improved functional mobility and gait. 11/15/20: improving but not to goal    Baseline see flowsheets    Status On-going      PT LONG TERM GOAL #3   Title Pt's gait will improve to walking s an AD with no to mild limp over the L LE and tolerate walking 1092f for community and work ambulation. 54//22: 6MWT=1059 c min limp L LE. 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 11/15/20: 8.5sec. 6/8: 8.0 sec Rvised goal to 7.5 sec or less    Status On-going      PT LONG TERM GOAL #6   Title Improved pt's FOTO score to the predicted value of 60% functional ability: 6/8: 52%    Baseline 42% functional ability    Status Partially Met                 Plan - 12/20/20 1643    Clinical Impression Statement Pt has progressed well with therapy.  He is no longer having pain.  His balance is improving.  His ROM is WNL.  He feels  he is better able to ambulate and navigate steps.  His TUG time has improved.  His FOTO score has improved.  He remains limited in LE strength and balance; he has a mild limp.  He will benefit from continue PT to address LE weakness and gait.    Personal Factors and Comorbidities  Fitness;Profession    Examination-Activity Limitations Locomotion Level;Transfers;Squat;Stairs;Stand;Lift;Carry;Reach Overhead    Examination-Participation Restrictions Occupation    Stability/Clinical Decision Making Stable/Uncomplicated    Rehab Potential Good    PT Frequency 1x / week    PT Duration 8 weeks    PT Treatment/Interventions ADLs/Self Care Home Management;Cryotherapy;Electrical Stimulation;Iontophoresis 55m/ml Dexamethasone;Moist Heat;Ultrasound;DME Instruction;Gait training;Stair training;Functional mobility training;Therapeutic activities;Therapeutic exercise;Balance training;Manual techniques;Patient/family education;Passive range of motion;Dry needling;Taping;Vasopneumatic Device    PT Next Visit Plan Re-assess FOTO. Update HEP. Progress ther ex including standing activities. Gait training. Goblet squats, hip hinge, dead lifts to progress towards work tasks of bending and lifting.    PT Home Exercise Plan [id number]-Squats, forward and lateral steps added to HEP; plus weight as tolerated for 3 way SLRs    Consulted and Agree with Plan of Care Patient           Patient will benefit from skilled therapeutic intervention in order to improve the following deficits and impairments:  Abnormal gait,Decreased range of motion,Difficulty walking,Decreased activity tolerance,Pain,Decreased balance,Decreased strength  Visit Diagnosis: Intertrochanteric fracture of left femur, sequela  Other fracture of shaft of left tibia, sequela  Difficulty in walking, not elsewhere classified  Other abnormalities of gait and mobility  Decreased ROM of left knee     Problem List Patient Active Problem List   Diagnosis Date Noted  . Subtrochanteric fracture of left femur ([place] 06/16/2020  . Fracture of femoral neck, left ([place] 06/16/2020  . Tibia/fibula fracture, left, open type I or II, initial encounter 06/15/2020  . Open displaced comminuted fracture of shaft of left tibia 06/15/2020     KShearon BaloPT, DPT 12/20/20 4:48 PM  [facility] [address]Phone: [phone]  Fax:  [phone] Name: Kyle PITREMRN: [id number] of Birth: 218-Jun-1977

## 2020-12-27 ENCOUNTER — Other Ambulatory Visit: Payer: Self-pay

## 2020-12-27 ENCOUNTER — Ambulatory Visit: Payer: 59

## 2020-12-27 DIAGNOSIS — R262 Difficulty in walking, not elsewhere classified: Secondary | ICD-10-CM

## 2020-12-27 DIAGNOSIS — S82292S Other fracture of shaft of left tibia, sequela: Secondary | ICD-10-CM

## 2020-12-27 DIAGNOSIS — M25672 Stiffness of left ankle, not elsewhere classified: Secondary | ICD-10-CM

## 2020-12-27 DIAGNOSIS — R2689 Other abnormalities of gait and mobility: Secondary | ICD-10-CM

## 2020-12-27 DIAGNOSIS — M6281 Muscle weakness (generalized): Secondary | ICD-10-CM

## 2020-12-27 DIAGNOSIS — S72142S Displaced intertrochanteric fracture of left femur, sequela: Secondary | ICD-10-CM | POA: Diagnosis not present

## 2020-12-27 DIAGNOSIS — M79605 Pain in left leg: Secondary | ICD-10-CM

## 2020-12-27 DIAGNOSIS — S82402D Unspecified fracture of shaft of left fibula, subsequent encounter for closed fracture with routine healing: Secondary | ICD-10-CM

## 2020-12-27 DIAGNOSIS — M25662 Stiffness of left knee, not elsewhere classified: Secondary | ICD-10-CM

## 2020-12-27 NOTE — Therapy (Signed)
[place], [place], [address] Phone: [phone]   Fax:  [phone]  Physical Therapy Treatment  Patient Details  Name: Kyle Spencer MRN: [id number] Date of Birth: 1975/08/31 Referring Provider (PT): Delray Alt, PA-C   Encounter Date: 12/27/2020   PT End of Session - 12/27/20 1402     Visit Number 11    Number of Visits 16    Date for PT Re-Evaluation 01/06/21    Authorization Type [employer]    Progress Note Due on Visit 10    PT Start Time 1334    PT Stop Time 1415    PT Time Calculation (min) 41 min    Activity Tolerance Patient tolerated treatment well    Behavior During Therapy [facility] for tasks assessed/performed             Past Medical History:  Diagnosis Date   Kidney stone     Past Surgical History:  Procedure Laterality Date   EXTERNAL FIXATION LEG Left 06/15/2020   Procedure: IRRIGATION AND DEBRIDEMENT and  EXTERNAL FIXATION OF LEFT ANKLE;  Surgeon: Leandrew Koyanagi, MD;  Location: [place];  Service: Orthopedics;  Laterality: Left;   I & D EXTREMITY Right 06/15/2020   Procedure: IRRIGATION AND DEBRIDEMENT RIGHT LOWER LEG;  Surgeon: Leandrew Koyanagi, MD;  Location: [place];  Service: Orthopedics;  Laterality: Right;   INTRAMEDULLARY (IM) NAIL INTERTROCHANTERIC Left 06/16/2020   Procedure: INTRAMEDULLARY (IM) NAIL INTERTROCHANTRIC;  Surgeon: Shona Needles, MD;  Location: [place];  Service: Orthopedics;  Laterality: Left;   KNEE SURGERY     TIBIA IM NAIL INSERTION Left 06/16/2020   Procedure: INTRAMEDULLARY (IM) NAIL TIBIAL;  Surgeon: Shona Needles, MD;  Location: [place];  Service: Orthopedics;  Laterality: Left;    There were no vitals filed for this visit.   Subjective Assessment - 12/27/20 1339     Subjective Pt reports going up/down 10 steps 4x prior to PT today for strengthening. Pt denies pain. 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 No/denies                               [facility] Adult PT Treatment/Exercise - 12/27/20 0001       Ambulation/Gait   Gait Comments Mild limp over the L LE      Knee/Hip Exercises: Machines for Strengthening   Cybex Leg Press L and R, 2x10; 45#    Hip Cybex L and R abduction, extension: 2x10 ea @ 25#, 37.5# respectively      Knee/Hip Exercises: Standing   SLS RDL, L and R, 10x2, no weight    SLS with Vectors L and R heel taps, clock pattern    Other Standing Knee Exercises Goblet squat 15lbs; 15 reps    Other Standing Knee Exercises Hinged hip lift, 35lbs, 10x                      PT Short Term Goals - 12/20/20 1222       PT SHORT TERM GOAL #1   Title Pt will be Ind in an initail HEP    Status Achieved    Target Date 09/30/20      PT SHORT TERM GOAL #2  Title Pt will progress to ambulation with a cane or single crutch s the cam boot    Baseline arrived without assistive device. uses crutches for steps, hills, prolonged distances and recently ordered new [facility] for occasional use as needed    Status Achieved    Target Date 09/30/20               PT Long Term Goals - 12/20/20 1231       PT LONG TERM GOAL #1   Title Pt's L knee AROM will increase to 0-125d and L ankle DF to 12d for improved functional mobility and gait. 11/15/20+ L knee ext=0d    Baseline L knee FL 129 - will plan to reassess L knee EXT next session    Status Achieved      PT LONG TERM GOAL #2   Title Increase L hip strength to 4+/5, knee to 5/5, and ankle to 5/5 for improved functional mobility and gait. 11/15/20: improving but not to goal    Baseline see flowsheets    Status On-going      PT LONG TERM GOAL #3   Title Pt's gait will improve to walking s an AD with no to mild limp over the L LE and tolerate walking 1065f for community and work ambulation. 54//22: 6MWT=1059 c min limp L LE. 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 11/15/20: 8.5sec. 6/8: 8.0 sec Rvised goal to 7.5 sec or less    Status On-going      PT LONG TERM GOAL #6   Title Improved pt's FOTO score to the predicted value of 60% functional ability: 6/8: 52%    Baseline 42% functional ability    Status Partially Met                   Plan - 12/27/20 1416     Clinical Impression Statement PT was completed for LE strengthening and balance. Pt tolerated progression of therex for increase in physical demand. 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 PT will continue to increase the challenge with the pt's rehab to assist with his return to work. Pt tolerated today's PT session without adverse effects.    Personal Factors and Comorbidities Fitness;Profession    Examination-Activity Limitations Locomotion Level;Transfers;Squat;Stairs;Stand;Lift;Carry;Reach Overhead    Examination-Participation Restrictions Occupation    Stability/Clinical Decision Making Stable/Uncomplicated    Clinical Decision Making Moderate    Rehab Potential Good    PT Frequency 1x / week    PT Duration 8 weeks    PT Treatment/Interventions ADLs/Self Care Home Management;Cryotherapy;Electrical Stimulation;Iontophoresis 12m26ml Dexamethasone;Moist Heat;Ultrasound;DME Instruction;Gait training;Stair training;Functional mobility training;Therapeutic activities;Therapeutic exercise;Balance training;Manual techniques;Patient/family education;Passive range of motion;Dry needling;Taping;Vasopneumatic Device    PT Next Visit Plan Re-assess for return to work.    PT Home Exercise Plan  [id number]-Squats, forward and lateral steps added to HEP; plus weight as tolerated for 3 way SLRs    Consulted and Agree with Plan of Care Patient  Patient will benefit from skilled therapeutic intervention in order to improve the following deficits and impairments:  Abnormal gait, Decreased range of motion, Difficulty walking, Decreased activity tolerance, Pain, Decreased balance, Decreased strength  Visit Diagnosis: Intertrochanteric fracture of left femur, sequela  Other fracture of shaft of left tibia, sequela  Other abnormalities of gait and mobility  Difficulty in walking, not elsewhere classified  Decreased ROM of left knee  Decreased range of motion of left ankle  Pain in left leg  Closed fracture of shaft of left fibula with routine healing, unspecified fracture morphology, subsequent encounter  Muscle weakness (generalized)     Problem List Patient Active Problem List   Diagnosis Date Noted   Subtrochanteric fracture of left femur ([place]) 06/16/2020   Fracture of femoral neck, left ([place]) 06/16/2020   Tibia/fibula fracture, left, open type I or II, initial encounter 06/15/2020   Open displaced comminuted fracture of shaft of left tibia 06/15/2020    Gar Ponto MS, PT 12/27/20 3:54 PM   [facility] [address] [place], [place], [address] Phone: [phone]   Fax:  [phone]  Name: CAMRIN GEARHEART MRN: [id number] Date of Birth: Feb 14, 1976

## 2021-01-03 ENCOUNTER — Other Ambulatory Visit: Payer: Self-pay

## 2021-01-03 ENCOUNTER — Ambulatory Visit: Payer: 59

## 2021-01-03 DIAGNOSIS — S72142S Displaced intertrochanteric fracture of left femur, sequela: Secondary | ICD-10-CM | POA: Diagnosis not present

## 2021-01-03 DIAGNOSIS — R2689 Other abnormalities of gait and mobility: Secondary | ICD-10-CM

## 2021-01-03 DIAGNOSIS — M6281 Muscle weakness (generalized): Secondary | ICD-10-CM

## 2021-01-03 DIAGNOSIS — S82402D Unspecified fracture of shaft of left fibula, subsequent encounter for closed fracture with routine healing: Secondary | ICD-10-CM

## 2021-01-03 DIAGNOSIS — M25662 Stiffness of left knee, not elsewhere classified: Secondary | ICD-10-CM

## 2021-01-03 DIAGNOSIS — M79605 Pain in left leg: Secondary | ICD-10-CM

## 2021-01-03 DIAGNOSIS — M25672 Stiffness of left ankle, not elsewhere classified: Secondary | ICD-10-CM

## 2021-01-03 DIAGNOSIS — S82292S Other fracture of shaft of left tibia, sequela: Secondary | ICD-10-CM

## 2021-01-03 DIAGNOSIS — R262 Difficulty in walking, not elsewhere classified: Secondary | ICD-10-CM

## 2021-01-03 NOTE — Therapy (Signed)
[place], [place], [address] Phone: [phone]   Fax:  [phone]  Physical Therapy Treatment/Discharge  Patient Details  Name: Kyle Spencer MRN: [id number] Date of Birth: [birth year] Referring Provider (PT): Delray Alt, PA-C   Encounter Date: 01/03/2021   PT End of Session - 01/03/21 1340     Visit Number 12    Number of Visits 16    Date for PT Re-Evaluation 01/06/21    Authorization Type [employer]    Progress Note Due on Visit 10    PT Start Time 1336    PT Stop Time 1415    PT Time Calculation (min) 39 min    Activity Tolerance Patient tolerated treatment well    Behavior During Therapy [facility] for tasks assessed/performed             Past Medical History:  Diagnosis Date   Kidney stone     Past Surgical History:  Procedure Laterality Date   EXTERNAL FIXATION LEG Left 06/15/2020   Procedure: IRRIGATION AND DEBRIDEMENT and  EXTERNAL FIXATION OF LEFT ANKLE;  Surgeon: Leandrew Koyanagi, MD;  Location: [place];  Service: Orthopedics;  Laterality: Left;   I & D EXTREMITY Right 06/15/2020   Procedure: IRRIGATION AND DEBRIDEMENT RIGHT LOWER LEG;  Surgeon: Leandrew Koyanagi, MD;  Location: [place];  Service: Orthopedics;  Laterality: Right;   INTRAMEDULLARY (IM) NAIL INTERTROCHANTERIC Left 06/16/2020   Procedure: INTRAMEDULLARY (IM) NAIL INTERTROCHANTRIC;  Surgeon: Shona Needles, MD;  Location: [place];  Service: Orthopedics;  Laterality: Left;   KNEE SURGERY     TIBIA IM NAIL INSERTION Left 06/16/2020   Procedure: INTRAMEDULLARY (IM) NAIL TIBIAL;  Surgeon: Shona Needles, MD;  Location: [place];  Service: Orthopedics;  Laterality: Left;    There were no vitals filed for this visit.   Subjective Assessment - 01/03/21 1338     Subjective Pt reports he is continuing to improve and thinks he is ready to try and return to work. Pt reports he experiences mid tibia pain with prolonged walking.     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 No/denies    Pain Score 0-No pain   3/10 c prolonged walking   Pain Location Tibia    Pain Orientation Left    Pain Descriptors / Indicators Discomfort    Pain Type Chronic pain    Pain Onset More than a month ago    Pain Frequency Intermittent                [facility] PT Assessment - 01/03/21 0001       Ambulation/Gait   Gait Comments 6 MWT= 1342 ft Mild limp becoming moderate over the course of the walk. pt reports mid-tibia      Timed Up and Go Test   Normal TUG (seconds) 7.2   7.4, 7.0                          [facility] Adult PT Treatment/Exercise - 01/03/21 0001       Knee/Hip Exercises: Aerobic   Nustep L8 x 5 min; LEs only      Knee/Hip Exercises: Machines for Strengthening   Cybex Leg Press L and R, 2x10; 45#      Knee/Hip Exercises: Standing   Forward Lunges Right;Left;2 sets;10 reps    Other Standing Knee Exercises Banded  exs c green Tband; side steps, forward and backward monster walks; 80fx2 each    Other Standing Knee Exercises Hinged hip lift, 45lbs, 10x                    PT Education - 01/03/21 2155     Education Details FInal HEP    Person(s) Educated Patient    Methods Explanation;Demonstration;Tactile cues;Verbal cues;Handout    Comprehension Verbalized understanding;Returned demonstration;Verbal cues required;Tactile cues required              PT Short Term Goals - 12/20/20 1222       PT SHORT TERM GOAL #1   Title Pt will be Ind in an initail HEP    Status Achieved    Target Date 09/30/20      PT SHORT TERM GOAL #2   Title Pt will progress to ambulation with a cane or single crutch s the cam boot    Baseline arrived without assistive device. uses crutches for steps, hills, prolonged distances and [facility] as needed    Status Achieved    Target Date 09/30/20               PT Long  Term Goals - 01/03/21 2158       PT LONG TERM GOAL #1   Title Pt's L knee AROM will increase to 0-125d and L ankle DF to 12d for improved functional mobility and gait. 11/15/20+ L knee ext=0d. L knee FL 129    Status Achieved      PT LONG TERM GOAL #2   Title Increase L hip strength to 4+/5, knee to 5/5, and ankle to 5/5 for improved functional mobility and gait. 11/15/20: improving but not to goal. 01/03/21: L hip=5/5    Status Achieved    Target Date 01/03/21      PT LONG TERM GOAL #3   Title Pt's gait will improve to walking s an AD with no to mild limp over the L LE and tolerate walking 10055ffor community and work ambulation. 54//22: 6MWT=1059 c min limp L LE. revised goal=130076for 6MWT with slight to no limp. 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 01/03/21: pt is able to complete partial lunges with 20lbs and hinged hip lifts with 45lbs    Status Achieved    Target Date 01/03/21      PT LONG TERM GOAL #5   Title Pt will improve TUG to </= 20 seconds to demonstrate improved functional mobility. 11/15/20: 8.5sec. 6/8: 8.0 sec Rvised goal to 7.5 sec or less. 01/03/21: 7.2. sec    Status Achieved    Target Date 01/03/21      PT LONG TERM GOAL #6   Title Improved pt's FOTO score to the predicted value of 60% functional ability: 6/8: 52%. 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 Pt believes he is ready to return to work. Pt performed today's activities well with the most  noticable deficit being prolonged walking which is not part of his work duties. With prolonged walking, pt's gait changes from a mild limp to a moderate limp due progressive mid tibia discomfort. Pt is DCed from PT with the majority of goals met and to a HEP to continue the rehab process. Pt is in agreement with DC.    Personal Factors and Comorbidities Fitness;Profession    Examination-Activity Limitations Locomotion Level;Transfers;Squat;Stairs;Stand;Lift;Carry;Reach Overhead    Examination-Participation Restrictions Occupation    Stability/Clinical Decision Making Stable/Uncomplicated    Clinical Decision Making Moderate    Rehab Potential Good    PT Frequency 1x / week    PT Duration 8 weeks    PT Treatment/Interventions ADLs/Self Care Home Management;Cryotherapy;Electrical Stimulation;Iontophoresis 38m/ml Dexamethasone;Moist Heat;Ultrasound;DME Instruction;Gait training;Stair training;Functional mobility training;Therapeutic activities;Therapeutic exercise;Balance training;Manual techniques;Patient/family education;Passive range of motion;Dry needling;Taping;Vasopneumatic Device    PT Home Exercise Plan [id number]    Consulted and Agree with Plan of Care Patient             Patient will benefit from skilled therapeutic intervention in order to improve the following deficits and impairments:  Abnormal gait, Decreased range of motion, Difficulty walking, Decreased activity tolerance, Pain, Decreased balance, Decreased strength  Visit Diagnosis: Intertrochanteric fracture of left femur, sequela  Other fracture of shaft of left tibia, sequela  Other abnormalities of gait and mobility  Difficulty in walking, not elsewhere classified  Decreased ROM of left knee  Decreased range of motion of left ankle  Pain in left leg  Closed fracture of shaft of left fibula with routine healing, unspecified fracture morphology, subsequent encounter  Muscle weakness (generalized)     Problem  List Patient Active Problem List   Diagnosis Date Noted   Subtrochanteric fracture of left femur ([place] 06/16/[place]   Fracture of [place], left ([place] 06/16/2020   Tibia/fibula fracture, left, open type I or II, initial encounter 06/15/2020   Open displaced comminuted fracture of shaft of left tibia 06/15/2020    PHYSICAL THERAPY DISCHARGE SUMMARY  Visits from Start of Care: 12  Current functional level related to goals / functional outcomes: See above   Remaining deficits: See above   Education / Equipment: HEP   Patient agrees to discharge. Patient goals were partially met (majority goals were met). Patient is being discharged due to being pleased with the current functional level.  AGar PontoMS, PT 01/03/21 10:23 PM   [facility]GW[address] N[place] [address]Phone: [phone]  Fax:  [phone] Name: Kyle VERONICAMRN: [id number] of Birth: [birth year]

## 2022-08-21 IMAGING — CT CT ANKLE*L* W/O CM
3 series · 13 of 35 positions shown, 16 images · non-contrast
Comparison: Radiographs 06/15/2020

CLINICAL DATA: Complex comminuted open fractures status post
debridement and external fixator placement.

EXAM:
CT OF THE LEFT ANKLE WITHOUT CONTRAST
TECHNIQUE: Multidetector CT imaging of the left ankle was performed according
to the standard protocol. Multiplanar CT image reconstructions were
also generated.

[Series 4: lfov ext 3.0 (person_name)40(person_name) (person_ · axial · 0.33mm/px · z∈[+261,+360]mm · 5 of 49 slices shown, 7 images]
[im 8/49  soft-tissue]
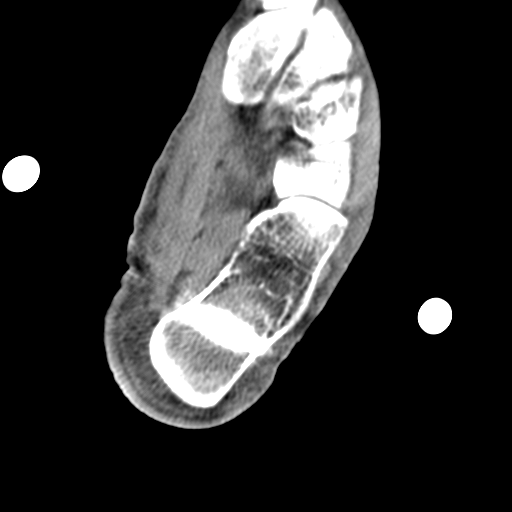
[im 8/49  bone]
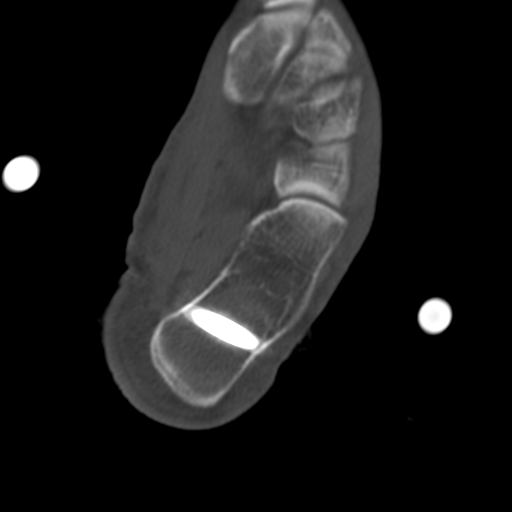
[im 15/49  bone]
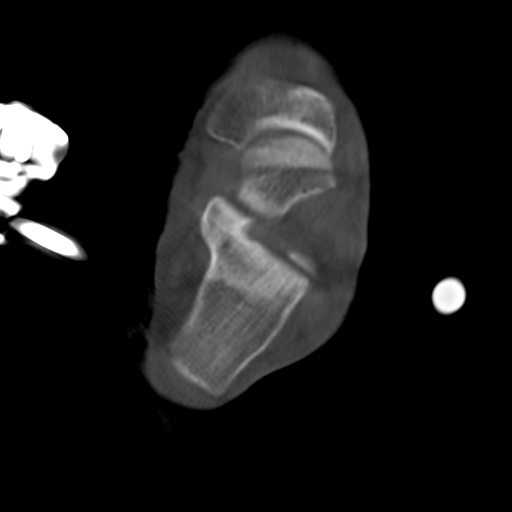
[im 26/49  bone]
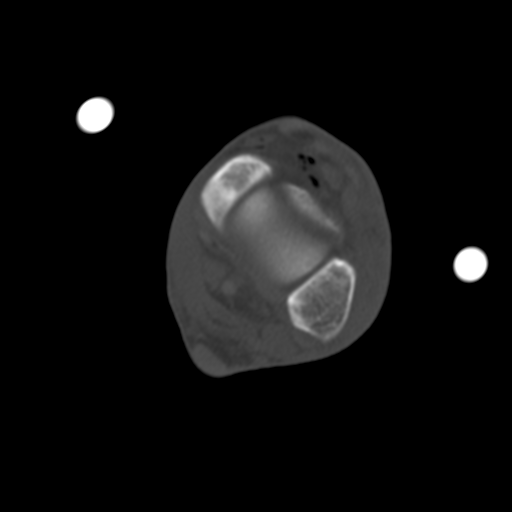
[im 34/49  bone]
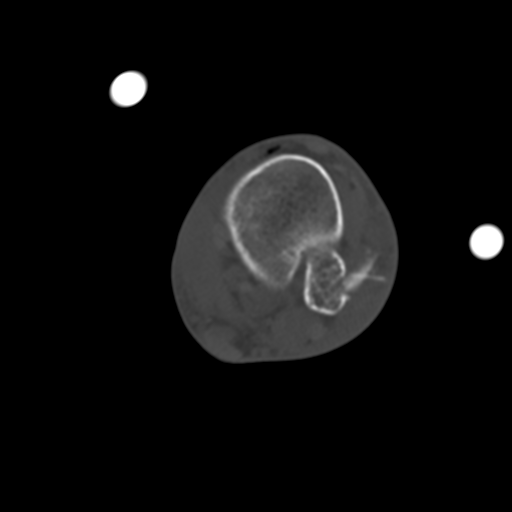
[im 41/49  soft-tissue]
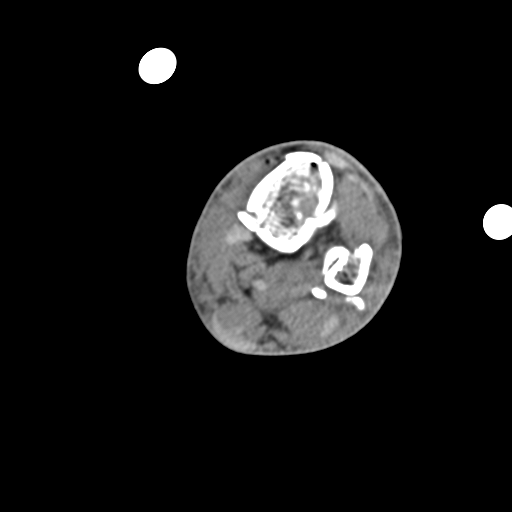
[im 41/49  bone]
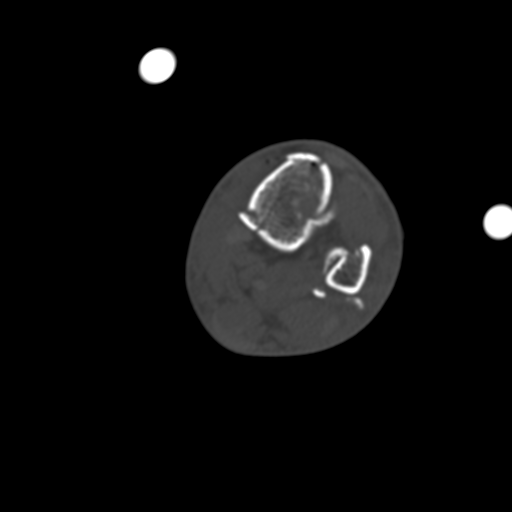

[Series 8: coronalsoft tissue · coronal · 0.27mm/px · 3 of 67 slices shown]
[im 14/67  bone]
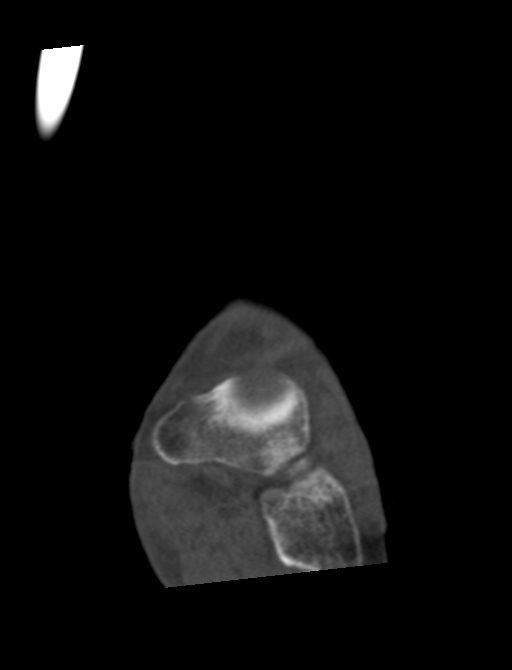
[im 27/67  bone]
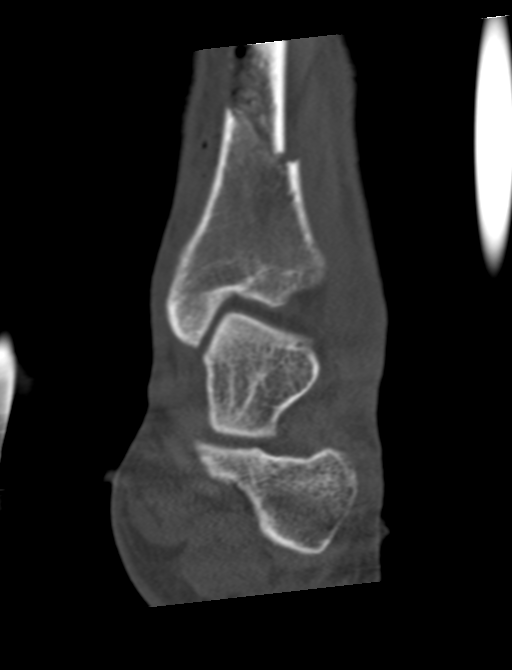
[im 40/67  bone]
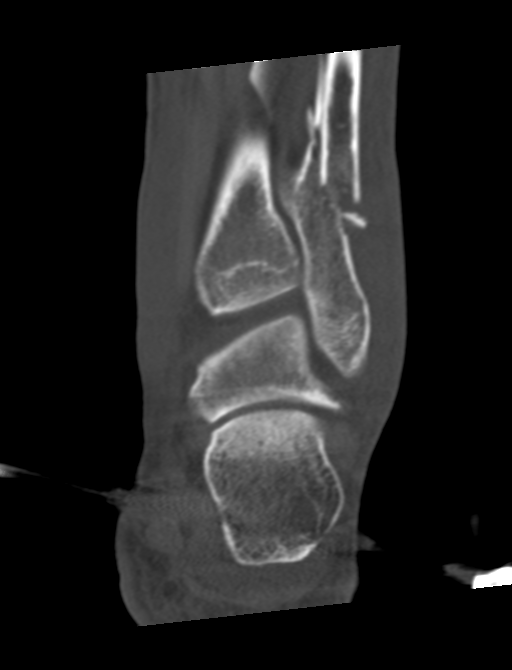

[Series 10: sagittalsoft tissue · sagittal · 0.26mm/px · 5 of 67 slices shown, 6 images]
[im 23/67  bone]
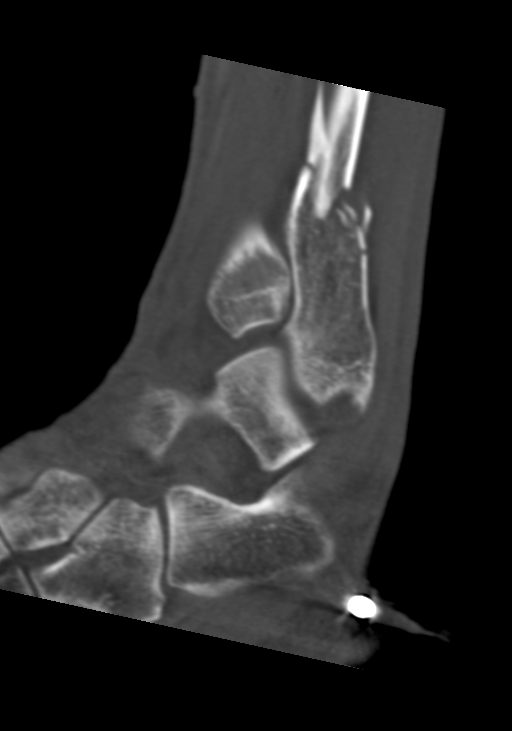
[im 28/67  bone]
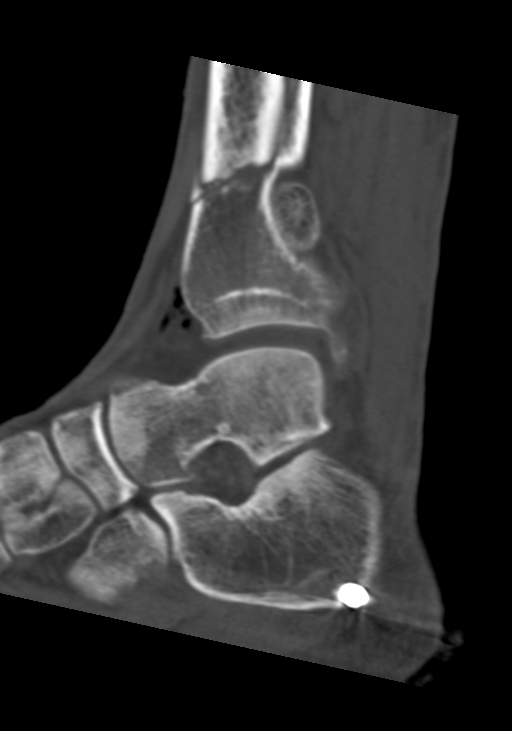
[im 34/67  soft-tissue]
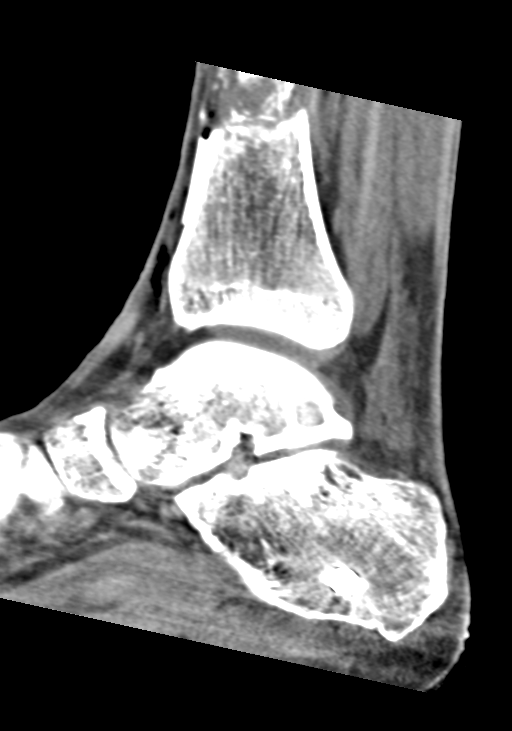
[im 34/67  bone]
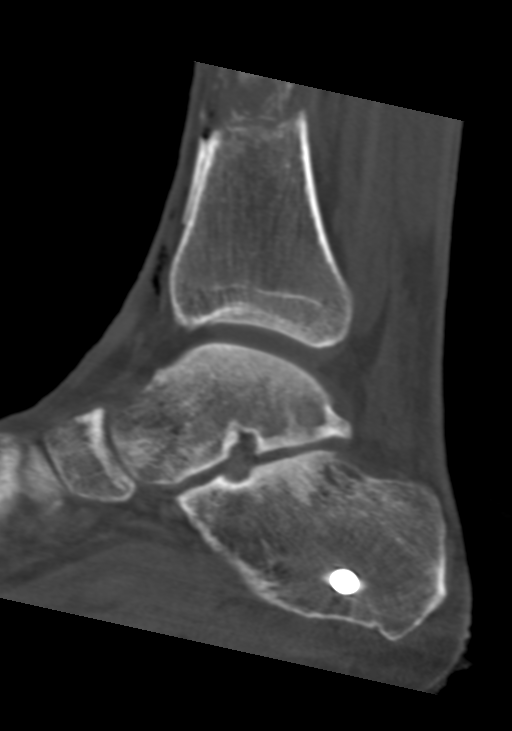
[im 39/67  bone]
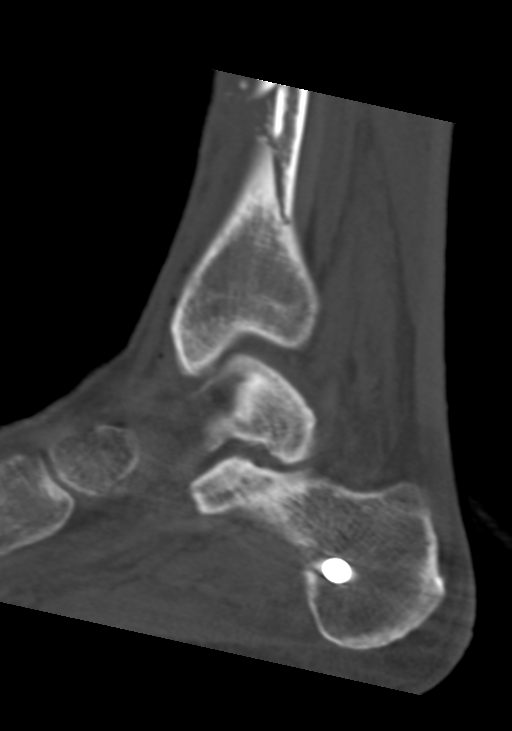
[im 45/67  bone]
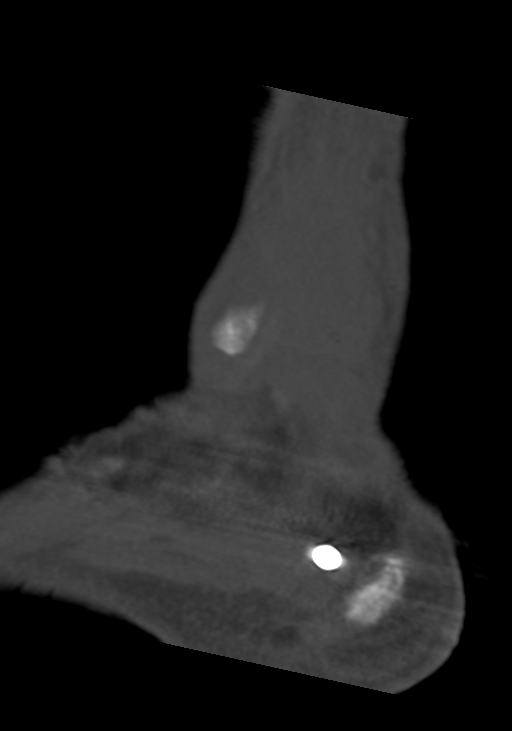

[13 of 35 positions shown; findings below may reference images not displayed]

FINDINGS: Complex comminuted fracture of the distal tibial shaft with improved
position and alignment following external fixator placement. Some
gas is noted in the soft tissues and in the fracture likely from the
recent surgical debridement.

Complex comminuted fracture of the distal fibular shaft is also
noted with approximately 1 shaft width of medial displacement. There
is also a moderate-sized osteochondroma projecting medially and
causing some pressure erosion of the lateral tibial shaft. This is
located just below the fracture.

The tibiotalar joint is maintained. No intra-articular fractures are
identified. The ankle mortise appears normal. The subtalar joints
are normal. Fixation screw noted in the calcaneus.
IMPRESSION: 1. Complex comminuted fracture of the distal tibial shaft with
improved position and alignment following external fixator
placement.
2. Complex comminuted fracture of the distal fibular shaft with
approximately 1 shaft width of medial displacement.
3. Moderate-sized osteochondroma projecting medially from the fibula
and causing some pressure erosion of the lateral tibial shaft.
4. No intra-articular fractures are identified.

## 2022-08-22 IMAGING — RF DG FEMUR 2+V*L*
1 series · 11 of 11 positions shown · non-contrast
Comparison: CT 06/15/2020.

CLINICAL DATA: ORIF left femur.

EXAM:
LEFT FEMUR 2 VIEWS

[Series 1: run · 11 of 11 slices shown]
[im 1/11]
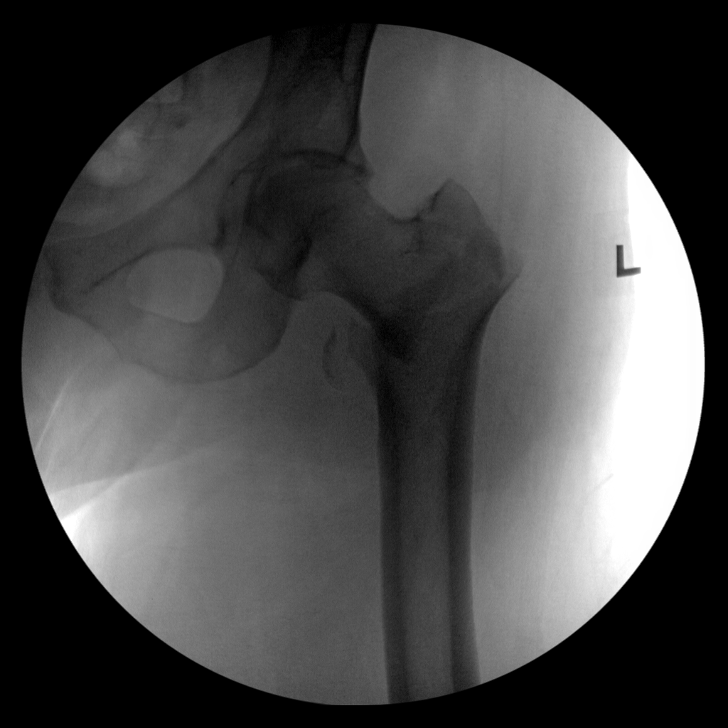
[im 2/11]
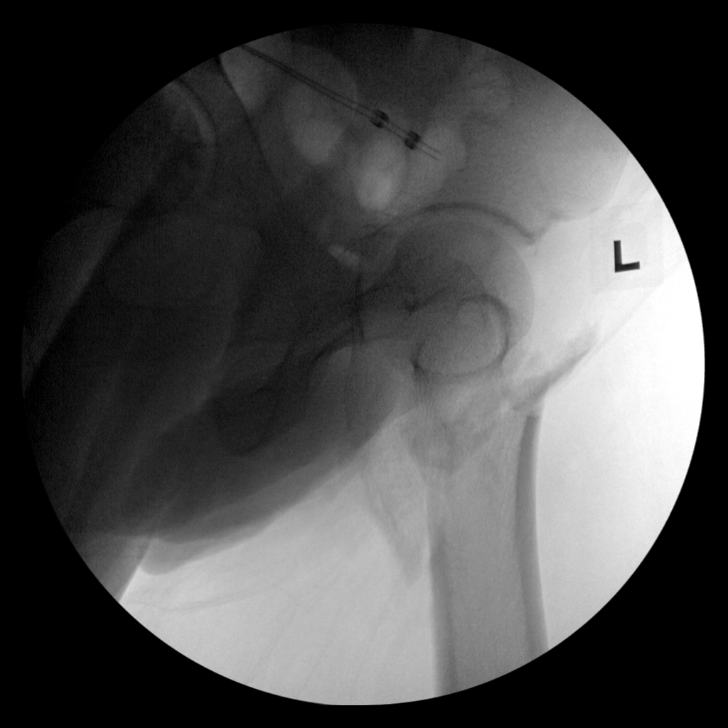
[im 3/11]
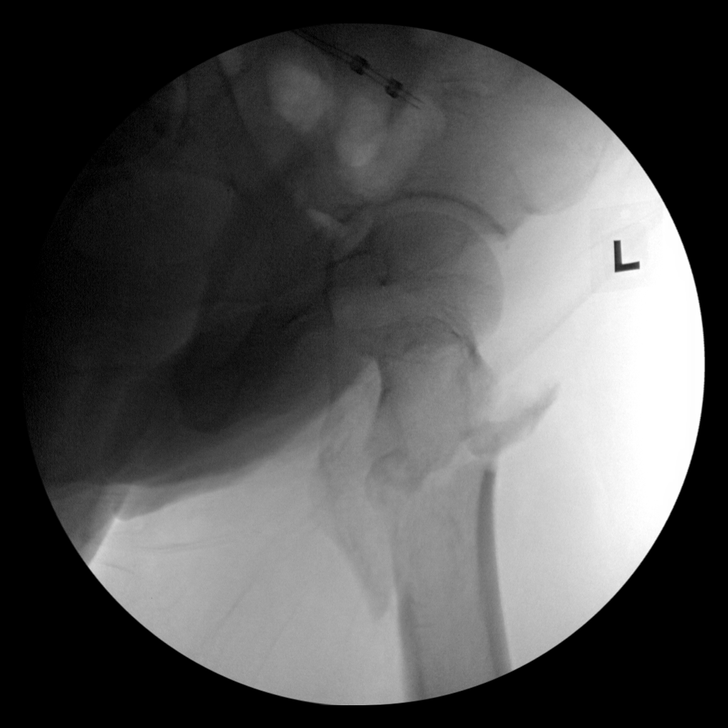
[im 4/11]
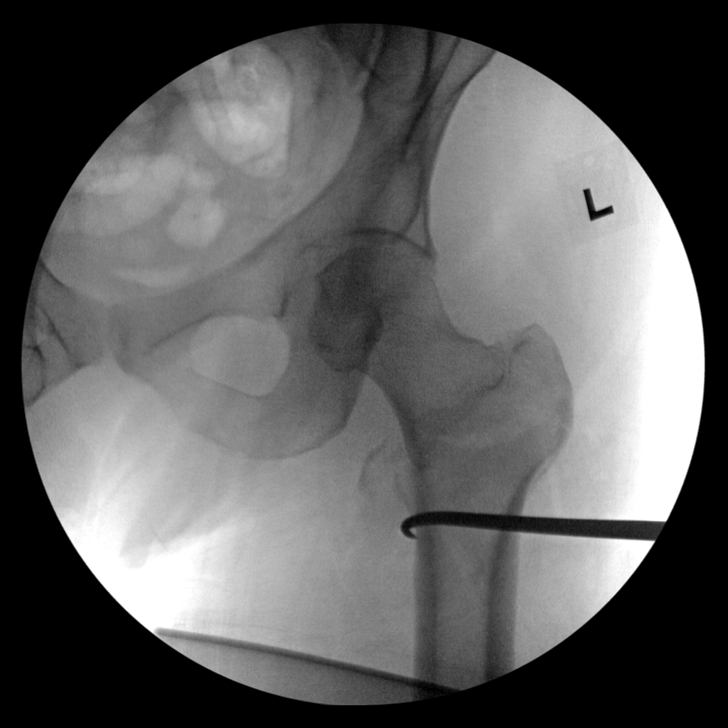
[im 5/11]
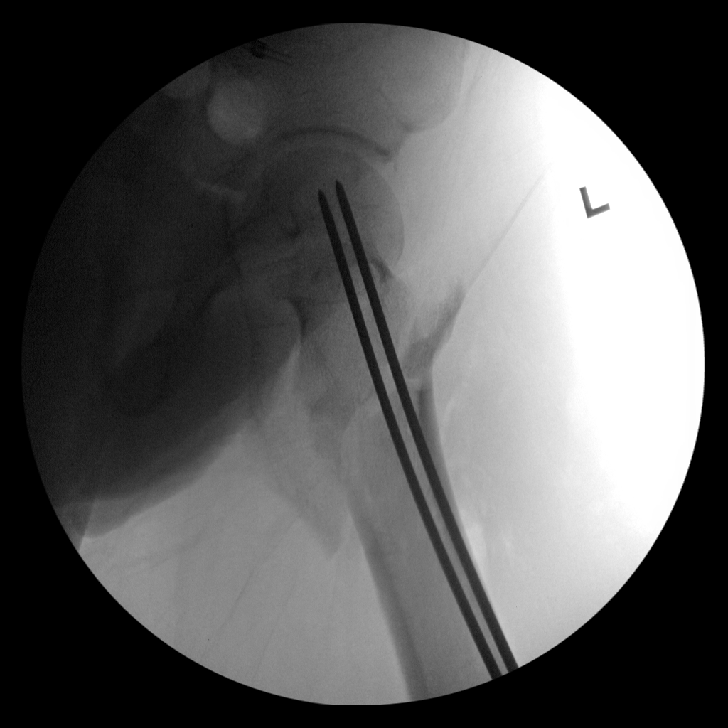
[im 6/11]
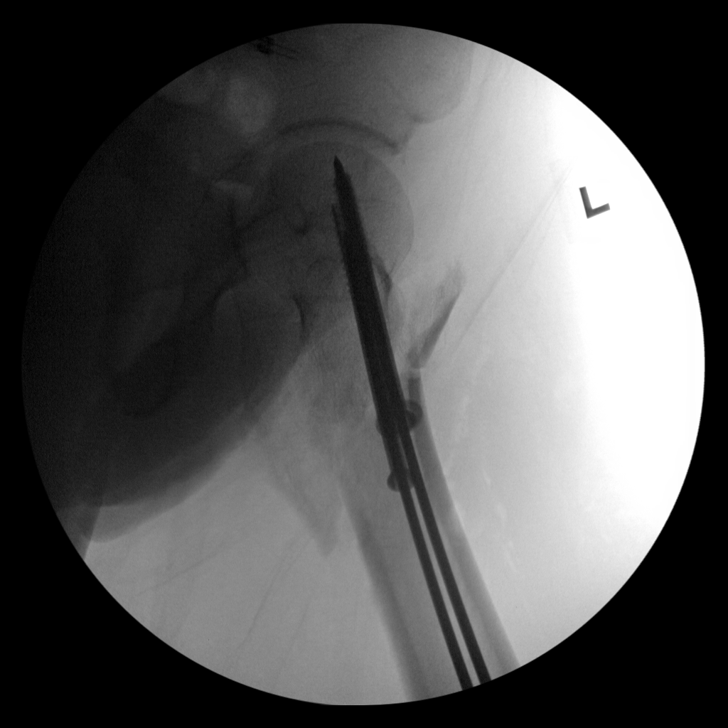
[im 7/11]
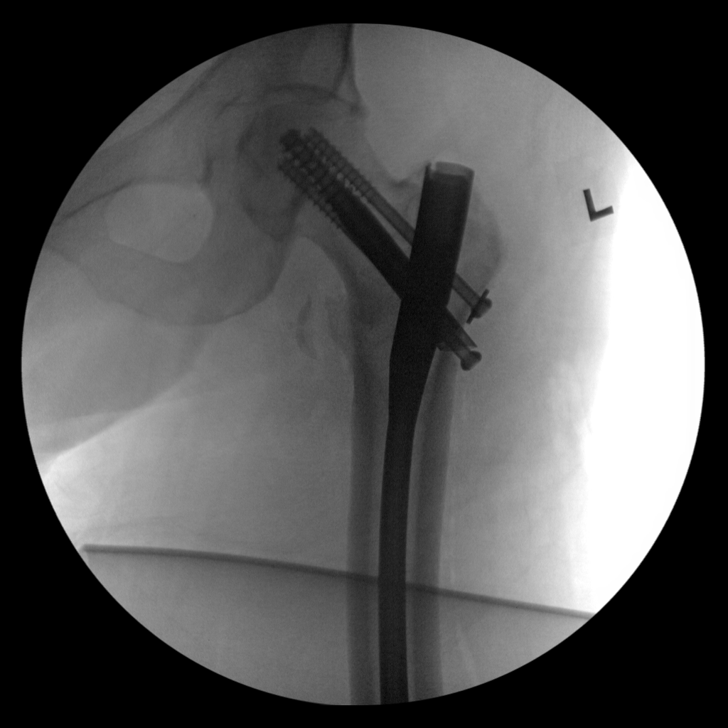
[im 8/11]
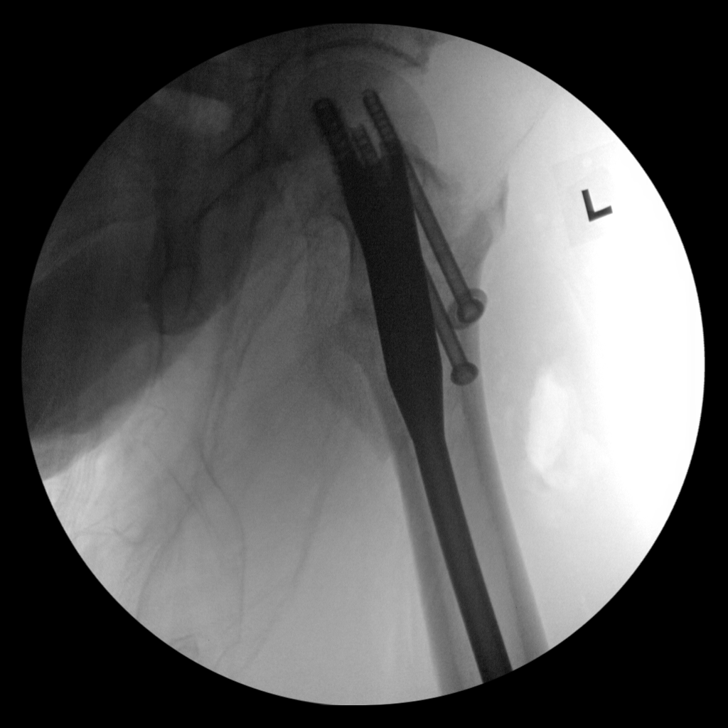
[im 9/11]
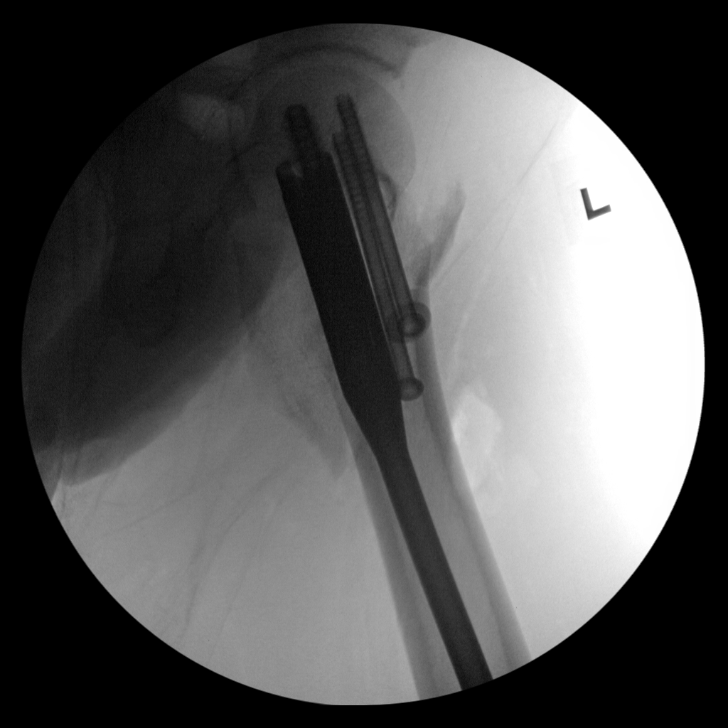
[im 10/11]
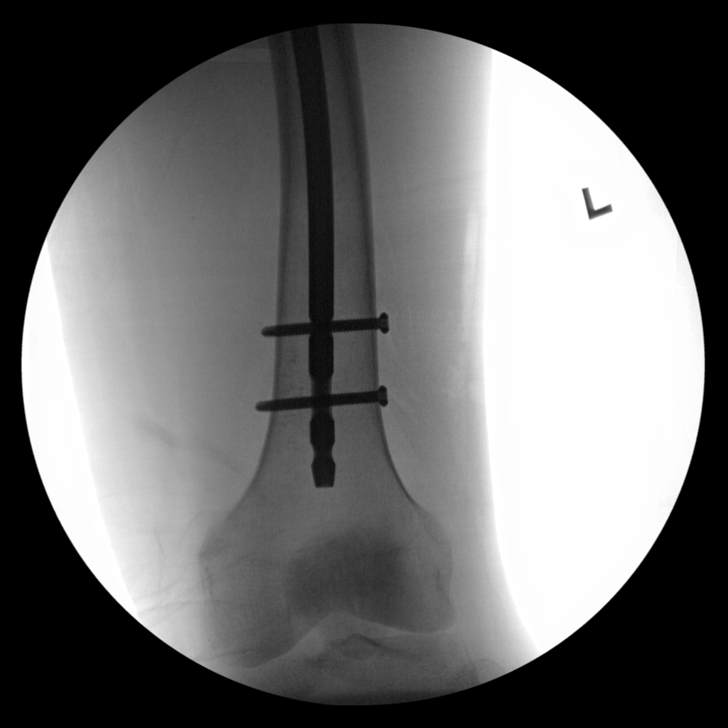
[im 11/11]
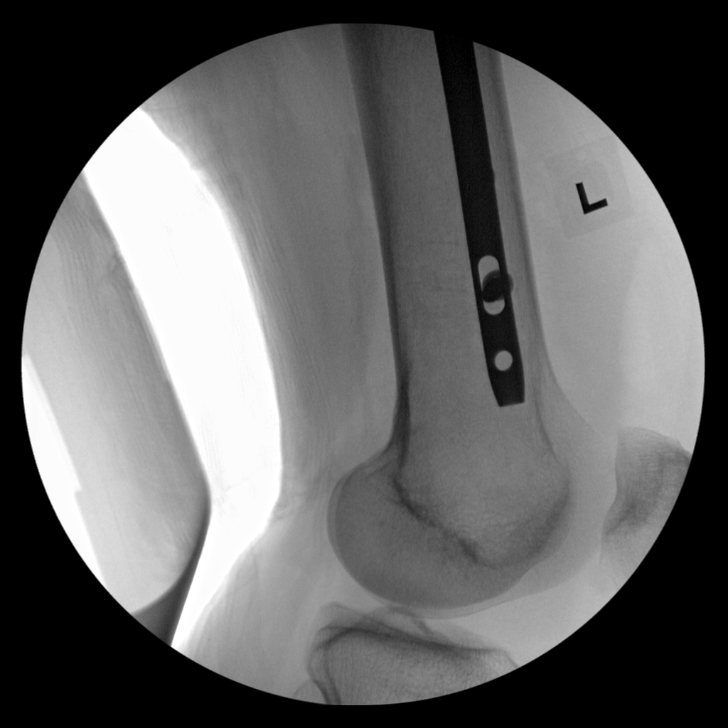

[11 of 11 positions shown; findings below may reference images not displayed]

FINDINGS: ORIF left femur.  Hardware intact.  Anatomic alignment.
IMPRESSION: ORIF left femur with anatomic alignment.
